# Patient Record
Sex: Female | Born: 1995 | Race: Black or African American | Hispanic: No | Marital: Married | Smoking: Never smoker
Health system: Southern US, Community
[De-identification: ages and names within clinical notes are randomized; demographics above are authoritative.]

## PROBLEM LIST (undated history)

## (undated) DIAGNOSIS — T7840XA Allergy, unspecified, initial encounter: Secondary | ICD-10-CM

## (undated) DIAGNOSIS — J45909 Unspecified asthma, uncomplicated: Secondary | ICD-10-CM

## (undated) DIAGNOSIS — D649 Anemia, unspecified: Secondary | ICD-10-CM

## (undated) DIAGNOSIS — M419 Scoliosis, unspecified: Secondary | ICD-10-CM

## (undated) HISTORY — DX: Unspecified asthma, uncomplicated: J45.909

## (undated) HISTORY — PX: WISDOM TOOTH EXTRACTION: SHX21

## (undated) HISTORY — DX: Allergy, unspecified, initial encounter: T78.40XA

## (undated) HISTORY — DX: Anemia, unspecified: D64.9

---

## 2018-07-01 ENCOUNTER — Emergency Department (HOSPITAL_COMMUNITY)
Admission: EM | Admit: 2018-07-01 | Discharge: 2018-07-01 | Disposition: A | Discharge status: Home / Self Care | Payer: Self-pay | Attending: Emergency Medicine | Admitting: Emergency Medicine

## 2018-07-01 ENCOUNTER — Encounter (HOSPITAL_COMMUNITY): Payer: Self-pay

## 2018-07-01 ENCOUNTER — Other Ambulatory Visit: Payer: Self-pay

## 2018-07-01 DIAGNOSIS — N390 Urinary tract infection, site not specified: Secondary | ICD-10-CM | POA: Insufficient documentation

## 2018-07-01 LAB — URINALYSIS, ROUTINE W REFLEX MICROSCOPIC
Bilirubin Urine: NEGATIVE
Glucose, UA: NEGATIVE mg/dL
Hgb urine dipstick: NEGATIVE
Ketones, ur: NEGATIVE mg/dL
Nitrite: NEGATIVE
Protein, ur: 30 mg/dL — AB
Specific Gravity, Urine: 1.021 (ref 1.005–1.030)
WBC, UA: >50 WBC/hpf — ABNORMAL HIGH (ref 0–5)
pH: 6 (ref 5.0–8.0)

## 2018-07-01 MED ORDER — CEPHALEXIN 500 MG PO CAPS: 500.0000 mg | ORAL_CAPSULE | Freq: Four times a day (QID) | ORAL | 0 refills | Status: DC

## 2018-07-01 NOTE — ED Provider Notes (Signed)
[facility] EMERGENCY DEPARTMENT Provider Note   CSN: [id number] Arrival date & time: 07/01/18  1546     History   Chief Complaint Chief Complaint  Patient presents with  . Urinary Tract Infection    HPI Anneke I Votaw is a 22 y.o. female who presents to ED for 5-day history of dysuria, urinary frequency.  States that she does not have a history of UTIs.  She does endorse having a kidney infection 2 years ago, but does not feel similar to the symptoms.  Has not taken any medications to help with her symptoms.  Denies any hematuria, possibility of pregnancy, back pain, fever, vaginal discharge or pelvic pain.  HPI  History reviewed. No pertinent past medical history.  There are no active problems to display for this patient.   Past Surgical History:  Procedure Laterality Date  . WISDOM TOOTH EXTRACTION       OB History   None      Home Medications    Prior to Admission medications   Medication Sig Start Date End Date Taking? Authorizing Provider  cephALEXin (KEFLEX) 500 MG capsule Take 1 capsule (500 mg total) by mouth 4 (four) times daily. 07/01/18   Dietrich Pates, PA-C    Family History No family history on file.  Social History Social History   Tobacco Use  . Smoking status: Not on file  Substance Use Topics  . Alcohol use: Not on file  . Drug use: Not on file     Allergies   Patient has no allergy information on record.   Review of Systems Review of Systems  Constitutional: Negative for chills and fever.  Gastrointestinal: Negative for abdominal pain.  Genitourinary: Positive for dysuria and frequency.     Physical Exam Updated Vital Signs BP 108/70 (BP Location: Right Arm)   Pulse 82   Temp 98.7 F (37.1 C) (Oral)   Resp 18   Ht 5\' 10"  (1.778 m)   Wt 56.7 kg   SpO2 100%   BMI 17.94 kg/m   Physical Exam  Constitutional: She appears well-developed and well-nourished. No distress.  HENT:  Head: Normocephalic  and atraumatic.  Eyes: Conjunctivae and EOM are normal. No scleral icterus.  Neck: Normal range of motion.  Pulmonary/Chest: Effort normal. No respiratory distress.  Abdominal: Soft. There is no tenderness.  No abdominal tenderness to palpation.  No CVA tenderness.  Neurological: She is alert.  Skin: No rash noted. She is not diaphoretic.  Psychiatric: She has a normal mood and affect.  Nursing note and vitals reviewed.    ED Treatments / Results  Labs (all labs ordered are listed, but only abnormal results are displayed) Labs Reviewed  URINALYSIS, ROUTINE W REFLEX MICROSCOPIC - Abnormal; Notable for the following components:      Result Value   APPearance HAZY (*)    Protein, ur 30 (*)    Leukocytes, UA SMALL (*)    WBC, UA >50 (*)    Bacteria, UA RARE (*)    All other components within normal limits    EKG None  Radiology No results found.  Procedures Procedures (including critical care time)  Medications Ordered in ED Medications - No data to display   Initial Impression / Assessment and Plan / ED Course  I have reviewed the triage vital signs and the nursing notes.  Pertinent labs & imaging results that were available during my care of the patient were reviewed by me and considered in my medical  decision making (see chart for details).     22 year old female presents to ED for 5-day history of dysuria and urinary frequency.  No history of similar symptoms in the past.  She did have what appears to be a kidney infection approximately 2 years ago.  On exam she is overall well-appearing.  She is afebrile with no recent use of antipyretics.  No abdominal tenderness to palpation or CVA tenderness noted.  Urinalysis shows evidence of UTI with leukocytes, pyuria and some bacteria.  Will give patient prescription for Keflex and advised her to complete the entire course of this medication.  Advised to return to ED for any severe worsening symptoms.  Patient is  hemodynamically stable, in NAD, and able to ambulate in the ED. Evaluation does not show pathology that would require ongoing emergent intervention or inpatient treatment. I explained the diagnosis to the patient. Pain has been managed and has no complaints prior to discharge. Patient is comfortable with above plan and is stable for discharge at this time. All questions were answered prior to disposition. Strict return precautions for returning to the ED were discussed. Encouraged follow up with PCP.    Portions of this note were generated with Scientist, clinical (histocompatibility and immunogenetics)Dragon dictation software. Dictation errors may occur despite best attempts at proofreading.   Final Clinical Impressions(s) / ED Diagnoses   Final diagnoses:  Lower urinary tract infectious disease    ED Discharge Orders         Ordered    cephALEXin (KEFLEX) 500 MG capsule  4 times daily     07/01/18 [id number]           Becky Berberian, PA-C 07/01/18 1646    Benjiman CorePickering, Nathan, MD 07/01/18 [phone]

## 2018-07-01 NOTE — Discharge Instructions (Signed)
Please complete the entire course of antibiotics regardless of symptom improvement to prevent worsening or recurrence of your infection. Take the Azo as needed for urinary discomfort. Return to ED for worsening symptoms, fevers, severe back pain or abdominal pain.

## 2018-07-01 NOTE — ED Triage Notes (Signed)
Pt reports burning and pain with urination since Saturday, hx of kidney infections.

## 2019-05-15 ENCOUNTER — Encounter (HOSPITAL_COMMUNITY): Payer: Self-pay | Admitting: Emergency Medicine

## 2019-05-15 ENCOUNTER — Other Ambulatory Visit: Payer: Self-pay

## 2019-05-15 ENCOUNTER — Emergency Department (HOSPITAL_COMMUNITY)
Admission: EM | Admit: 2019-05-15 | Discharge: 2019-05-16 | Disposition: A | Discharge status: Home / Self Care | Payer: Self-pay | Attending: Emergency Medicine | Admitting: Emergency Medicine

## 2019-05-15 DIAGNOSIS — R1013 Epigastric pain: Secondary | ICD-10-CM | POA: Insufficient documentation

## 2019-05-15 DIAGNOSIS — R1011 Right upper quadrant pain: Secondary | ICD-10-CM | POA: Insufficient documentation

## 2019-05-15 DIAGNOSIS — R112 Nausea with vomiting, unspecified: Secondary | ICD-10-CM

## 2019-05-15 LAB — CBC
HCT: 34 % — ABNORMAL LOW (ref 36.0–46.0)
Hemoglobin: 11.7 g/dL — ABNORMAL LOW (ref 12.0–15.0)
MCH: 31.3 pg (ref 26.0–34.0)
MCHC: 34.4 g/dL (ref 30.0–36.0)
MCV: 90.9 fL (ref 80.0–100.0)
Platelets: 245 10*3/uL (ref 150–400)
RBC: 3.74 MIL/uL — ABNORMAL LOW (ref 3.87–5.11)
RDW: 12.2 % (ref 11.5–15.5)
WBC: 6 10*3/uL (ref 4.0–10.5)
nRBC: 0 % (ref 0.0–0.2)

## 2019-05-15 LAB — COMPREHENSIVE METABOLIC PANEL
ALT: 13 U/L (ref 0–44)
AST: 27 U/L (ref 15–41)
Albumin: 3.7 g/dL (ref 3.5–5.0)
Alkaline Phosphatase: 72 U/L (ref 38–126)
Anion gap: 11 (ref 5–15)
BUN: 7 mg/dL (ref 6–20)
CO2: 23 mmol/L (ref 22–32)
Calcium: 9 mg/dL (ref 8.9–10.3)
Chloride: 102 mmol/L (ref 98–111)
Creatinine, Ser: 0.9 mg/dL (ref 0.44–1.00)
GFR calc Af Amer: >60 mL/min (ref >60)
GFR calc non Af Amer: >60 mL/min (ref >60)
Glucose, Bld: 116 mg/dL — ABNORMAL HIGH (ref 70–99)
Potassium: 3.8 mmol/L (ref 3.5–5.1)
Sodium: 136 mmol/L (ref 135–145)
Total Bilirubin: 0.3 mg/dL (ref 0.3–1.2)
Total Protein: 7.3 g/dL (ref 6.5–8.1)

## 2019-05-15 LAB — URINALYSIS, ROUTINE W REFLEX MICROSCOPIC
Bacteria, UA: NONE SEEN
Bilirubin Urine: NEGATIVE
Glucose, UA: NEGATIVE mg/dL
Hgb urine dipstick: NEGATIVE
Ketones, ur: 20 mg/dL — AB
Leukocytes,Ua: NEGATIVE
Nitrite: NEGATIVE
Protein, ur: 30 mg/dL — AB
Specific Gravity, Urine: 1.025 (ref 1.005–1.030)
pH: 5 (ref 5.0–8.0)

## 2019-05-15 LAB — I-STAT BETA HCG BLOOD, ED (MC, WL, AP ONLY): I-stat hCG, quantitative: <5 m[IU]/mL (ref <5)

## 2019-05-15 LAB — LIPASE, BLOOD: Lipase: 29 U/L (ref 11–51)

## 2019-05-15 MED ORDER — SODIUM CHLORIDE 0.9% FLUSH
3.0000 mL | Freq: Once | INTRAVENOUS | Status: AC
  Administered 2019-05-16: 3 mL via INTRAVENOUS

## 2019-05-15 NOTE — ED Triage Notes (Signed)
C/o nausea/vomiting that last for 1 day every month since March.  Denies abd pain.  Thought it was related to period but nausea/vomiting didn't occur at the same time this month.

## 2019-05-16 ENCOUNTER — Emergency Department (HOSPITAL_COMMUNITY): Payer: Self-pay

## 2019-05-16 MED ORDER — ONDANSETRON 4 MG PO TBDP: 4.0000 mg | ORAL_TABLET | Freq: Three times a day (TID) | ORAL | 0 refills | Status: DC | PRN

## 2019-05-16 MED ORDER — ONDANSETRON HCL 4 MG/2ML IJ SOLN
4.0000 mg | Freq: Once | INTRAMUSCULAR | Status: AC
  Administered 2019-05-16: 4 mg via INTRAVENOUS
  Filled 2019-05-16: qty 2

## 2019-05-16 MED ORDER — SODIUM CHLORIDE 0.9 % IV BOLUS
1000.0000 mL | Freq: Once | INTRAVENOUS | Status: AC
  Administered 2019-05-16: 1000 mL via INTRAVENOUS

## 2019-05-16 MED ORDER — FENTANYL CITRATE (PF) 100 MCG/2ML IJ SOLN
50.0000 ug | Freq: Once | INTRAMUSCULAR | Status: AC
  Administered 2019-05-16: 50 ug via INTRAVENOUS
  Filled 2019-05-16: qty 2

## 2019-05-16 NOTE — ED Provider Notes (Signed)
[facility] Provider Note   CSN: [id number] Arrival date & time: 05/15/19  2121     History   Chief Complaint Chief Complaint  Patient presents with  . Nausea  . Emesis    HPI Sara Duran is a 23 y.o. female with no significant past medical history who presents to the ED for nausea and vomiting. Patient notes that since changing her birth control in March she has had monthly episodes of emesis 13 days prior to her menstrual period; however, this month she had her normal emesis 13 days prior to her menstrual period and now is vomiting again. Patient notes she has had 3 episodes of non-bloody, bilious emesis today. Her LMP was 10/19. Emesis is associated with RUQ and epigastric pain. She denies recent antibiotic usage. Normal bowel movements with her last being today. No abdominal surgeries. Patient denies fever, chills, urinary symptoms, vaginal symptoms, chest pain, and shortness of breath.   History reviewed. No pertinent past medical history.  There are no active problems to display for this patient.   Past Surgical History:  Procedure Laterality Date  . WISDOM TOOTH EXTRACTION       OB History   No obstetric history on file.      Home Medications    Prior to Admission medications   Medication Sig Start Date End Date Taking? Authorizing Provider  ibuprofen (ADVIL) 600 MG tablet Take 500 tablets by mouth as needed for pain. 10/11/15  Yes [provider]  cephALEXin (KEFLEX) 500 MG capsule Take 1 capsule (500 mg total) by mouth 4 (four) times daily. Patient not taking: Reported on 05/16/2019 07/01/18   Dietrich PatesKhatri, Hina, PA-C  ondansetron (ZOFRAN ODT) 4 MG disintegrating tablet Take 1 tablet (4 mg total) by mouth every 8 (eight) hours as needed for nausea or vomiting. 05/16/19   Renee Harderheek, Nyshawn Gowdy B, PA-C    Family History No family history on file.  Social History Social History   Tobacco Use  . Smoking status: Never  Smoker  . Smokeless tobacco: Never Used  Substance Use Topics  . Alcohol use: Never    Frequency: Never  . Drug use: Never     Allergies   Patient has no allergy information on record.   Review of Systems Review of Systems  Constitutional: Negative for chills and fever.  Respiratory: Negative for shortness of breath.   Cardiovascular: Negative for chest pain.  Gastrointestinal: Positive for abdominal pain, nausea and vomiting. Negative for abdominal distention, constipation and diarrhea.     Physical Exam Updated Vital Signs BP 103/61 (BP Location: Right Arm)   Pulse 82   Temp 98.9 F (37.2 C) (Oral)   Resp 14   LMP 05/06/2019   SpO2 99%   Physical Exam Constitutional:      General: She is not in acute distress.    Appearance: She is not ill-appearing.  HENT:     Head: Normocephalic.  Neck:     Musculoskeletal: Neck supple.  Cardiovascular:     Rate and Rhythm: Normal rate and regular rhythm.     Pulses: Normal pulses.     Heart sounds: Normal heart sounds. No murmur. No friction rub. No gallop.   Pulmonary:     Effort: Pulmonary effort is normal.     Breath sounds: Normal breath sounds.  Abdominal:     General: Abdomen is flat. Bowel sounds are normal. There is no distension.     Palpations: Abdomen is soft.  Tenderness: There is abdominal tenderness. There is no guarding or rebound.     Comments: Tenderness in epigastric and RUQ region. Negative Murphys. No rebound. No guarding.  Musculoskeletal: Normal range of motion.     Comments: Able to move all extremities without difficulty  Skin:    General: Skin is warm.  Neurological:     General: No focal deficit present.     Mental Status: She is alert.      ED Treatments / Results  Labs (all labs ordered are listed, but only abnormal results are displayed) Labs Reviewed  COMPREHENSIVE METABOLIC PANEL - Abnormal; Notable for the following components:      Result Value   Glucose, Bld 116 (*)    All  other components within normal limits  CBC - Abnormal; Notable for the following components:   RBC 3.74 (*)    Hemoglobin 11.7 (*)    HCT 34.0 (*)    All other components within normal limits  URINALYSIS, ROUTINE W REFLEX MICROSCOPIC - Abnormal; Notable for the following components:   Ketones, ur 20 (*)    Protein, ur 30 (*)    All other components within normal limits  LIPASE, BLOOD  I-STAT BETA HCG BLOOD, ED ([facility], [facility], [facility] ONLY)    EKG None  Radiology US Abdomen Limited Ruq  Result Date: 05/16/2019 CLINICAL DATA:  Right upper quadrant pain and nausea. EXAM: ULTRASOUND ABDOMEN LIMITED RIGHT UPPER QUADRANT COMPARISON:  None. FINDINGS: Gallbladder: No gallstones or wall thickening visualized. No sonographic Murphy sign noted by sonographer. Common bile duct: Diameter: 1.7 mm Liver: No focal lesion identified. Within normal limits in parenchymal echogenicity. Portal vein is patent on color Doppler imaging with normal direction of blood flow towards the liver. Other: None. IMPRESSION: 1. Normal exam. Electronically Signed   By: Kerby Moors M.D.   On: 05/16/2019 06:27    Procedures Procedures (including critical care time)  Medications Ordered in ED Medications  sodium chloride flush (NS) 0.9 % injection 3 mL (3 mLs Intravenous Given 05/16/19 0421)  ondansetron (ZOFRAN) injection 4 mg (4 mg Intravenous Given 05/16/19 0423)  sodium chloride 0.9 % bolus 1,000 mL (1,000 mLs Intravenous New Bag/Given 05/16/19 0421)  fentaNYL (SUBLIMAZE) injection 50 mcg (50 mcg Intravenous Given 05/16/19 0423)     Initial Impression / Assessment and Plan / ED Course  I have reviewed the triage vital signs and the nursing notes.  Pertinent labs & imaging results that were available during my care of the patient were reviewed by me and considered in my medical decision making (see chart for details).       Sara Duran is a 23 year old female who presents to the ED for an evaluation of emesis.  Patient is afebrile. Vitals within normal limits. On physical exam, patient is in no acute distress and rather well appearing. Abdomen is soft, non-distended with tenderness to palpation in epigastric and RUQ. Negative Murphys. No rebound. No guarding. Triage ordered routine labs. Will place order for RUQ [place] given she has had 3 episodes of bilious emesis today. Low suspicion of appendicitis due to negative McBurney's point tenderness. CT not warranted at this time. Will give 1L bolus, Zofran, and fentanyl.   CBC unremarkable except for mild anemia at 11.7. No labs in chart to compare to baseline, but asymptomatic. CMP unremarkable. UA positive for proteinuria and ketones. Suspect due to emesis and decreased appetite. No signs of infection. Lipase normal. RUQ US unremarkable which I have personally reviewed.  Discussed results with patient. Will treat patient symptomatically with Zofran. Patient given GI number and instructed to call on Monday to schedule an appointment. PCP phone number also given to patient. Strict ED precautions discussed with patient. Patient states understanding and agrees to plan. Patient discharged home in no acute distress and vitals within normal limits.  Final Clinical Impressions(s) / ED Diagnoses   Final diagnoses:  RUQ pain  Intractable vomiting with nausea, unspecified vomiting type    ED Discharge Orders         Ordered    ondansetron (ZOFRAN ODT) 4 MG disintegrating tablet  Every 8 hours PRN     05/16/19 0648           Renee Harder, PA-C 05/16/19 2229    Gilda Crease, MD 05/16/19 [phone]

## 2019-05-16 NOTE — ED Notes (Signed)
Pt taken to US

## 2019-05-16 NOTE — Discharge Instructions (Addendum)
I have given you a number for a GI doctor. Please call them on Monday and schedule an appointment for further evaluation. I am also including a number for a primary care doctor. I recommend calling them to establish care. Please return to the ER if you experience new or worsening symptoms. I am also sending you home with Zofran. Use as needed for nausea.

## 2019-05-17 ENCOUNTER — Encounter: Payer: Self-pay | Admitting: Physician Assistant

## 2019-05-26 ENCOUNTER — Ambulatory Visit (INDEPENDENT_AMBULATORY_CARE_PROVIDER_SITE_OTHER): Payer: BC Managed Care – PPO | Admitting: Physician Assistant

## 2019-05-26 ENCOUNTER — Encounter: Payer: Self-pay | Admitting: Physician Assistant

## 2019-05-26 ENCOUNTER — Other Ambulatory Visit: Payer: Self-pay

## 2019-05-26 VITALS — BP 86/60 | HR 76 | Temp 98.8°F | Ht 68.75 in | Wt 122.2 lb

## 2019-05-26 DIAGNOSIS — Z1159 Encounter for screening for other viral diseases: Secondary | ICD-10-CM

## 2019-05-26 DIAGNOSIS — R112 Nausea with vomiting, unspecified: Secondary | ICD-10-CM | POA: Diagnosis not present

## 2019-05-26 MED ORDER — ONDANSETRON HCL 4 MG PO TABS: 4.0000 mg | ORAL_TABLET | Freq: Three times a day (TID) | ORAL | 1 refills | Status: DC | PRN

## 2019-05-26 NOTE — Progress Notes (Signed)
Chief Complaint: Follow-up ER visit for intractable vomiting and nausea  HPI:    Sara Duran is a 23 year old female with a past medical history as listed below, who presents to clinic today after being seen in the ER on 05/15/2019 with intractable nausea and vomiting.    05/15/2019 patient was seen in the ER and described that since changing her birth control March she had monthly episodes of vomiting 13 days prior to her menstrual period, however at that time went through this but then also was vomiting again.  She had 3 episodes of nonbloody, bilious emesis.  Also with right upper quadrant epigastric pain.  CMP was normal.  CBC with a hemoglobin minimally decreased 11.7.  Lipase normal, beta-hCG normal.  Right upper quadrant ultrasound was normal.  Patient was given Zofran.    Today, the patient tells me that since March and changing to a new birth control she has had trouble with nausea and vomiting.  Typically this happens 13 days before she gets her period.  This has happened every month, but this last time it started after she had already gotten her period which was abnormal for her.  Tells me that typically when these episodes come on they last a few hours, but this last one lasted 8 hours, where she is so nauseous that she just continuously vomits.  Patient tells me she was still nauseous a week ago Monday, 06/17/2019 and took a Zofran which did help.  She has continued to needs some Zofran over the past week.      Does admit to smoking marijuana twice a day, tells me it helps her with the nausea as well as going to sleep.  Tells me she did take a break from marijuana for 6 months and this made no change to her nausea symptoms.  Tells me she has been smoking twice a day every day for the past year.    Denies fever, chills, abdominal pain or hematemesis or change in bowel habits.  Past Medical History:  Diagnosis Date  . Asthma     Past Surgical History:  Procedure Laterality Date  .  WISDOM TOOTH EXTRACTION      Current Outpatient Medications  Medication Sig Dispense Refill  . acetaminophen (TYLENOL) 500 MG tablet Take 500 mg by mouth as needed.    . ondansetron (ZOFRAN ODT) 4 MG disintegrating tablet Take 1 tablet (4 mg total) by mouth every 8 (eight) hours as needed for nausea or vomiting. 20 tablet 0   No current facility-administered medications for this visit.     Allergies as of 05/26/2019  . (No Known Allergies)    Family History  Problem Relation Age of Onset  . Diabetes Paternal Grandfather     Social History   Socioeconomic History  . Marital status: Single    Spouse name: Not on file  . Number of children: 0  . Years of education: Not on file  . Highest education level: Not on file  Occupational History  . Occupation: [employer]  . Financial resource strain: Not on file  . Food insecurity    Worry: Not on file    Inability: Not on file  . Transportation needs    Medical: Not on file    Non-medical: Not on file  Tobacco Use  . Smoking status: Never Smoker  . Smokeless tobacco: Never Used  Substance and Sexual Activity  . Alcohol use: Yes    Frequency: Never  Comment: occasional  . Drug use: Never  . Sexual activity: Not on file  Lifestyle  . Physical activity    Days per week: Not on file    Minutes per session: Not on file  . Stress: Not on file  Relationships  . Social Herbalist on phone: Not on file    Gets together: Not on file    Attends religious service: Not on file    Active member of club or organization: Not on file    Attends meetings of clubs or organizations: Not on file    Relationship status: Not on file  . Intimate partner violence    Fear of current or ex partner: Not on file    Emotionally abused: Not on file    Physically abused: Not on file    Forced sexual activity: Not on file  Other Topics Concern  . Not on file  Social History Narrative  . Not on file    Review of  Systems:    Constitutional: No weight loss, fever or chills Skin: No rash Cardiovascular: No chest pain Respiratory: No SOB  Gastrointestinal: See HPI and otherwise negative Genitourinary: No dysuria  Neurological: No headache, dizziness or syncope Musculoskeletal: No new muscle or joint pain Hematologic: No bleeding  Psychiatric: No history of depression or anxiety   Physical Exam:  Vital signs: Temp 98.8 F (37.1 C)   Ht 5' 8.75" (1.746 m) Comment: height measured without shoes  Wt 122 lb 4 oz (55.5 kg)   LMP 05/06/2019   BMI 18.18 kg/m   Constitutional:   Pleasant AA female appears to be in NAD, Well developed, Well nourished, alert and cooperative Head:  Normocephalic and atraumatic. Eyes:   PEERL, EOMI. No icterus. Conjunctiva pink. Ears:  Normal auditory acuity. Neck:  Supple Throat: Oral cavity and pharynx without inflammation, swelling or lesion.  Respiratory: Respirations even and unlabored. Lungs clear to auscultation bilaterally.   No wheezes, crackles, or rhonchi.  Cardiovascular: Normal S1, S2. No MRG. Regular rate and rhythm. No peripheral edema, cyanosis or pallor.  Gastrointestinal:  Soft, nondistended, nontender. No rebound or guarding. Normal bowel sounds. No appreciable masses or hepatomegaly. Rectal:  Not performed.  Msk:  Symmetrical without gross deformities. Without edema, no deformity or joint abnormality.  Neurologic:  Alert and  oriented x4;  grossly normal neurologically.  Skin:   Dry and intact without significant lesions or rashes. Psychiatric: Demonstrates good judgement and reason without abnormal affect or behaviors.  RELEVANT LABS AND IMAGING: CBC    Component Value Date/Time   WBC 6.0 05/15/2019 2232   RBC 3.74 (L) 05/15/2019 2232   HGB 11.7 (L) 05/15/2019 2232   HCT 34.0 (L) 05/15/2019 2232   PLT 245 05/15/2019 2232   MCV 90.9 05/15/2019 2232   MCH 31.3 05/15/2019 2232   MCHC 34.4 05/15/2019 2232   RDW 12.2 05/15/2019 2232    CMP      Component Value Date/Time   NA 136 05/15/2019 2232   K 3.8 05/15/2019 2232   CL 102 05/15/2019 2232   CO2 23 05/15/2019 2232   GLUCOSE 116 (H) 05/15/2019 2232   BUN 7 05/15/2019 2232   CREATININE 0.90 05/15/2019 2232   CALCIUM 9.0 05/15/2019 2232   PROT 7.3 05/15/2019 2232   ALBUMIN 3.7 05/15/2019 2232   AST 27 05/15/2019 2232   ALT 13 05/15/2019 2232   ALKPHOS 72 05/15/2019 2232   BILITOT 0.3 05/15/2019 2232   GFRNONAA >60 05/15/2019  2232   GFRAA >60 05/15/2019 2232    Assessment: 1.  Cyclical nausea and vomiting: Seems related to menstrual cycle ever since patient switched birth controls, but most recently has become continuous over the past couple of weeks, does admit to marijuana use twice daily; consider relation to marijuana versus other  Plan: 1.  Discussed with patient that it very well could be her marijuana which is causing her nausea and vomiting but patient tells me that she does not believe this is the case.  She would like further investigation.  Told her to try taking a hot shower the next time she is very nauseous to see if this helps.  If it does more than likely it is the marijuana. 2.  Scheduled the patient for an EGD in the [facility] with Dr. Christella HartiganJacobs.  Did discuss risks, benefits, limitations and alternatives and the patient agrees to proceed. 3.  Prescribed Zofran 4 mg every 4-6 hours as needed for nausea #30 with 1 refill. 4.  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  Explained that she should discuss trying a switch to a different birth control because symptoms seemed to have started around starting this new one. 5.  Patient to follow in clinic per recommendations from Dr. Christella HartiganJacobs after time of procedure.  Hyacinth MeekerJennifer Dasean Brow, PA-C [facility] 05/26/2019, 2:03 PM

## 2019-05-26 NOTE — Patient Instructions (Signed)
You have been scheduled for an endoscopy. Please follow written instructions given to you at your visit today. If you use inhalers (even only as needed), please bring them with you on the day of your procedure.  We have sent the following medications to your pharmacy for you to pick up at your convenience:  Zofran 4 mg every 4-6 hours as needed for nausea

## 2019-05-27 NOTE — Progress Notes (Signed)
I agree with the above note, plan 

## 2019-06-09 ENCOUNTER — Encounter: Payer: Self-pay | Admitting: Gastroenterology

## 2019-06-15 ENCOUNTER — Telehealth: Payer: Self-pay | Admitting: Gastroenterology

## 2019-06-15 NOTE — Telephone Encounter (Signed)
covid test rescheduled for 06-21-19 and EGD rescheduled for 06-23-19 at 1000.  Dr. Ardis Hughs,  This pt had a co-worker that he had direct contact with who was covid positive last week.  Pt states she was in full PPE.  Is she ok to still come on Wednesday, 06-23-19?  Thanks, [employer]

## 2019-06-15 NOTE — Telephone Encounter (Signed)
Yes.  Ok to still plan for that.  Definitely needs the pre procedure covid test on the 30th.  Thanks

## 2019-06-16 ENCOUNTER — Encounter: Payer: BC Managed Care – PPO | Admitting: Gastroenterology

## 2019-06-21 ENCOUNTER — Other Ambulatory Visit: Payer: Self-pay | Admitting: Gastroenterology

## 2019-06-21 ENCOUNTER — Ambulatory Visit (INDEPENDENT_AMBULATORY_CARE_PROVIDER_SITE_OTHER): Payer: Self-pay

## 2019-06-21 DIAGNOSIS — Z1159 Encounter for screening for other viral diseases: Secondary | ICD-10-CM

## 2019-06-22 LAB — SARS CORONAVIRUS 2 (TAT 6-24 HRS): SARS Coronavirus 2: NEGATIVE

## 2019-06-23 ENCOUNTER — Other Ambulatory Visit: Payer: Self-pay

## 2019-06-23 ENCOUNTER — Ambulatory Visit (AMBULATORY_SURGERY_CENTER): Payer: BC Managed Care – PPO | Admitting: Gastroenterology

## 2019-06-23 ENCOUNTER — Encounter: Payer: Self-pay | Admitting: Gastroenterology

## 2019-06-23 VITALS — BP 127/90 | HR 110 | Temp 98.4°F | Resp 15 | Ht 68.0 in | Wt 122.0 lb

## 2019-06-23 DIAGNOSIS — K2950 Unspecified chronic gastritis without bleeding: Secondary | ICD-10-CM

## 2019-06-23 DIAGNOSIS — R112 Nausea with vomiting, unspecified: Secondary | ICD-10-CM

## 2019-06-23 MED ORDER — SODIUM CHLORIDE 0.9 % IV SOLN: 500.0000 mL | Freq: Once | INTRAVENOUS | Status: DC

## 2019-06-23 NOTE — Op Note (Signed)
[facility] Patient Name: Sara BankerDavette Knobloch Procedure Date: 06/23/2019 9:48 AM MRN: [id number] Endoscopist: Rachael Feeaniel P Rebakah Cokley , MD Age: 23 Referring MD:  Date of Birth: 04/27/1996 Gender: Female Account #: [id number] Procedure:                Upper GI endoscopy Indications:              Nausea with vomiting Medicines:                Monitored Anesthesia Care Procedure:                Pre-Anesthesia Assessment:                           - Prior to the procedure, a History and Physical                            was performed, and patient medications and                            allergies were reviewed. The patient's tolerance of                            previous anesthesia was also reviewed. The risks                            and benefits of the procedure and the sedation                            options and risks were discussed with the patient.                            All questions were answered, and informed consent                            was obtained. Prior Anticoagulants: The patient has                            taken no previous anticoagulant or antiplatelet                            agents. ASA Grade Assessment: II - A patient with                            mild systemic disease. After reviewing the risks                            and benefits, the patient was deemed in                            satisfactory condition to undergo the procedure.                           After obtaining informed consent, the endoscope was  passed under direct vision. Throughout the                            procedure, the patient's blood pressure, pulse, and                            oxygen saturations were monitored continuously. The                            Endoscope was introduced through the mouth, and                            advanced to the second part of duodenum. The upper                            GI endoscopy was accomplished  without difficulty.                            The patient tolerated the procedure well. Scope In: Scope Out: Findings:                 Minimal inflammation characterized by erythema was                            found in the gastric antrum. Biopsies were taken                            with a cold forceps for histology.                           The exam was otherwise without abnormality. Complications:            No immediate complications. Estimated blood loss:                            None. Estimated Blood Loss:     Estimated blood loss: none. Impression:               - Very mild distal gastritis. Biopsied to check for                            H. pylori.                           - The examination was otherwise normal. Recommendation:           - Patient has a contact number available for                            emergencies. The signs and symptoms of potential                            delayed complications were discussed with the                            patient. Return to normal activities tomorrow.  Written discharge instructions were provided to the                            patient.                           - Resume previous diet.                           - Continue present medications.                           - Await pathology results. IF biopsies are positive                            for H. pylori then you will be started on                            appropriate antibiotics.                           - Since your symptoms started around the time of                            your new birth control meds and also seem to occur                            on a montly cycle you should consider changing to a                            different BCP (under direction of your PCP or                            gynecologist).                           - Daily marijuana use can contribute to nausea. Rachael Fee, MD 06/23/2019 10:02:10  AM This report has been signed electronically.

## 2019-06-23 NOTE — Progress Notes (Signed)
Report given to PACU, vss 

## 2019-06-23 NOTE — Patient Instructions (Signed)
YOU HAD AN ENDOSCOPIC PROCEDURE TODAY AT THE [facility]:   Refer to the procedure report that was given to you for any specific questions about what was found during the examination.  If the procedure report does not answer your questions, please call your gastroenterologist to clarify.  If you requested that your care partner not be given the details of your procedure findings, then the procedure report has been included in a sealed envelope for you to review at your convenience later.  YOU SHOULD EXPECT: Some feelings of bloating in the abdomen. Passage of more gas than usual.  Walking can help get rid of the air that was put into your GI tract during the procedure and reduce the bloating. If you had a lower endoscopy (such as a colonoscopy or flexible sigmoidoscopy) you may notice spotting of blood in your stool or on the toilet paper. If you underwent a bowel prep for your procedure, you may not have a normal bowel movement for a few days.  Please Note:  You might notice some irritation and congestion in your nose or some drainage.  This is from the oxygen used during your procedure.  There is no need for concern and it should clear up in a day or so.  SYMPTOMS TO REPORT IMMEDIATELY:   Following upper endoscopy (EGD)  Vomiting of blood or coffee ground material  New chest pain or pain under the shoulder blades  Painful or persistently difficult swallowing  New shortness of breath  Fever of 100F or higher  Black, tarry-looking stools  For urgent or emergent issues, a gastroenterologist can be reached at any hour by calling [phone].   DIET:  We do recommend a small meal at first, but then you may proceed to your regular diet.  Drink plenty of fluids but you should avoid alcoholic beverages for 24 hours.  ACTIVITY:  You should plan to take it easy for the rest of today and you should NOT DRIVE or use heavy machinery until tomorrow (because of the sedation medicines used  during the test).    FOLLOW UP: Our staff will call the number listed on your records 48-72 hours following your procedure to check on you and address any questions or concerns that you may have regarding the information given to you following your procedure. If we do not reach you, we will leave a message.  We will attempt to reach you two times.  During this call, we will ask if you have developed any symptoms of COVID 19. If you develop any symptoms (ie: fever, flu-like symptoms, shortness of breath, cough etc.) before then, please call [phone].  If you test positive for Covid 19 in the 2 weeks post procedure, please call and report this information to us.    If any biopsies were taken you will be contacted by phone or by letter within the next 1-3 weeks.  Please call us at [phone] if you have not heard about the biopsies in 3 weeks.    SIGNATURES/CONFIDENTIALITY: You and/or your care partner have signed paperwork which will be entered into your electronic medical record.  These signatures attest to the fact that that the information above on your After Visit Summary has been reviewed and is understood.  Full responsibility of the confidentiality of this discharge information lies with you and/or your care-partner. 

## 2019-06-23 NOTE — Progress Notes (Signed)
Called to room to assist during endoscopic procedure.  Patient ID and intended procedure confirmed with present staff. Received instructions for my participation in the procedure from the performing physician.  

## 2019-06-25 ENCOUNTER — Telehealth: Payer: Self-pay | Admitting: *Deleted

## 2019-06-25 ENCOUNTER — Telehealth: Payer: Self-pay

## 2019-06-25 NOTE — Telephone Encounter (Signed)
No answer for post procedure call back. Not able to leave message. 

## 2019-06-25 NOTE — Telephone Encounter (Signed)
Left message on follow up call. 

## 2019-06-28 ENCOUNTER — Encounter: Payer: Self-pay | Admitting: Gastroenterology

## 2020-07-01 IMAGING — US US ABDOMEN LIMITED
1 series · 14 of 25 positions shown · non-contrast
Comparison: None.

CLINICAL DATA: Right upper quadrant pain and nausea.

EXAM:
ULTRASOUND ABDOMEN LIMITED RIGHT UPPER QUADRANT

[Series 1: us abdomen limited · 14 of 53 slices shown]
[im 1/53]
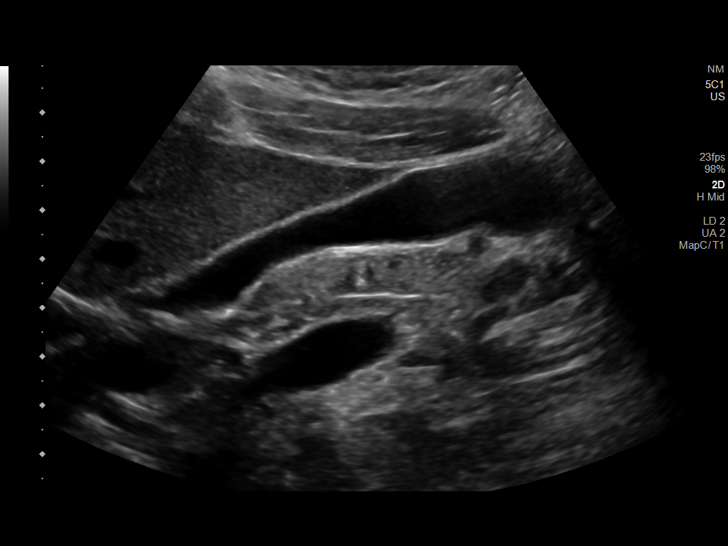
[im 5/53]
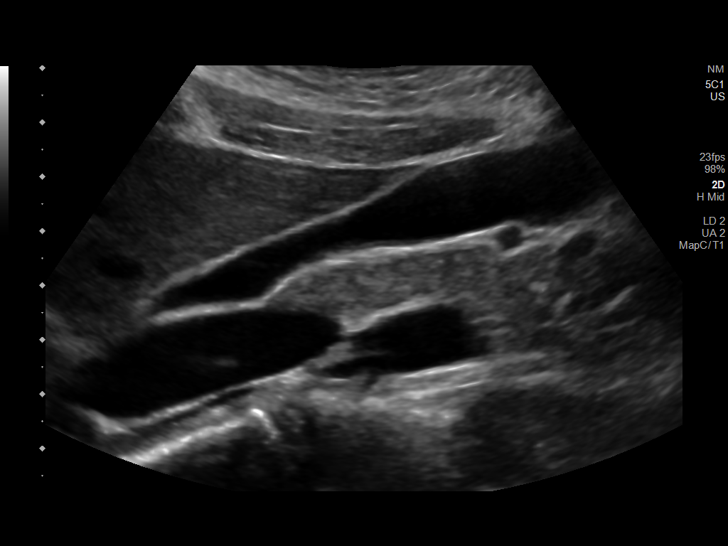
[im 9/53]
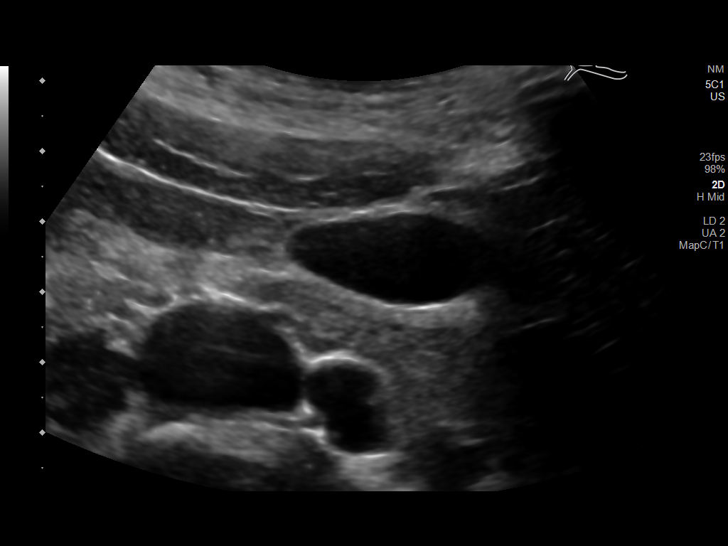
[im 14/53]
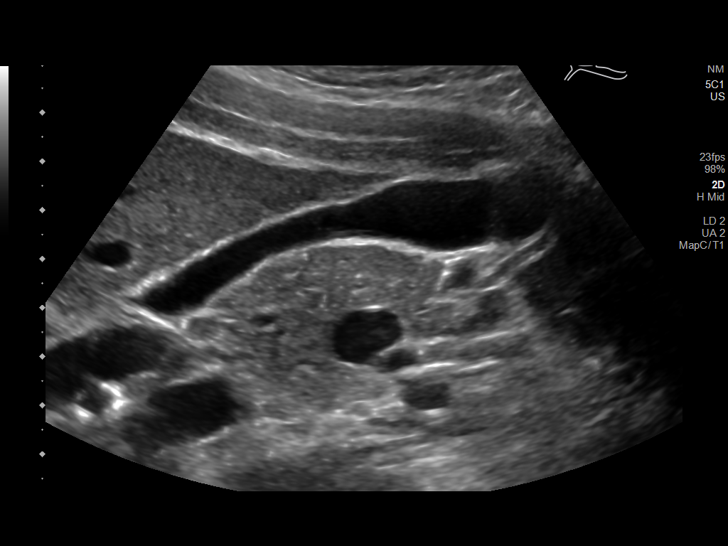
[im 18/53]
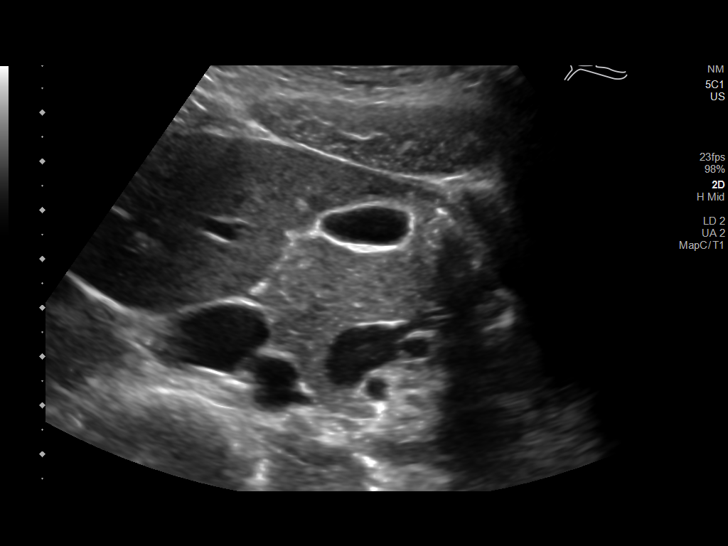
[im 20/53]
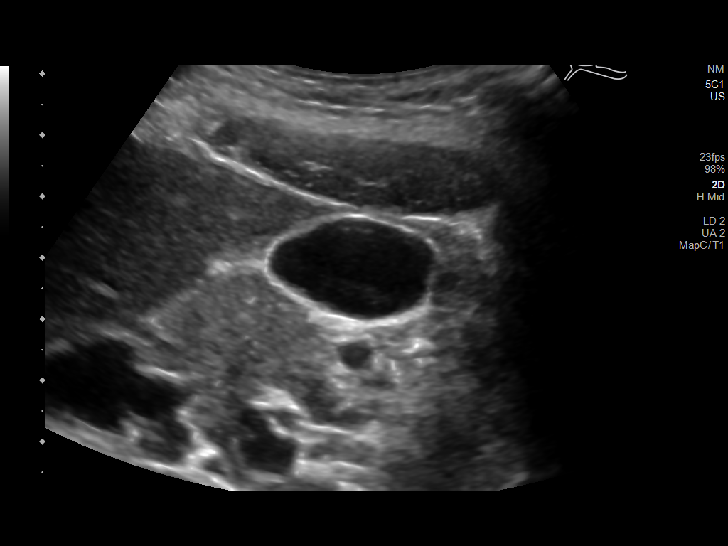
[im 24/53]
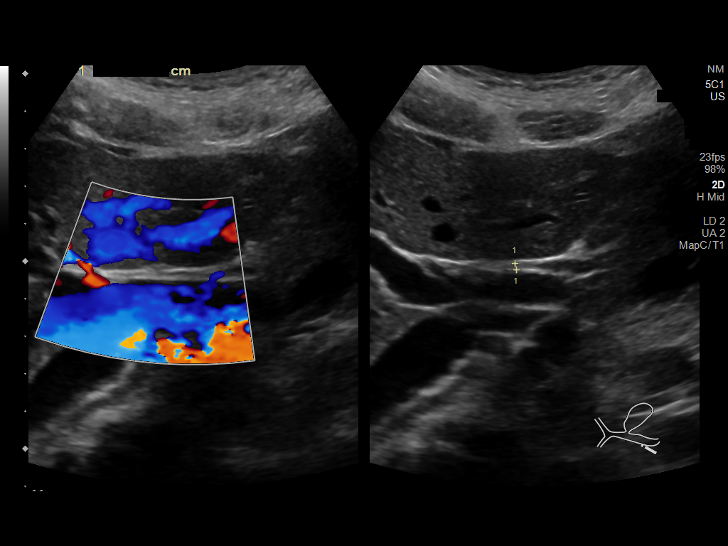
[im 29/53]
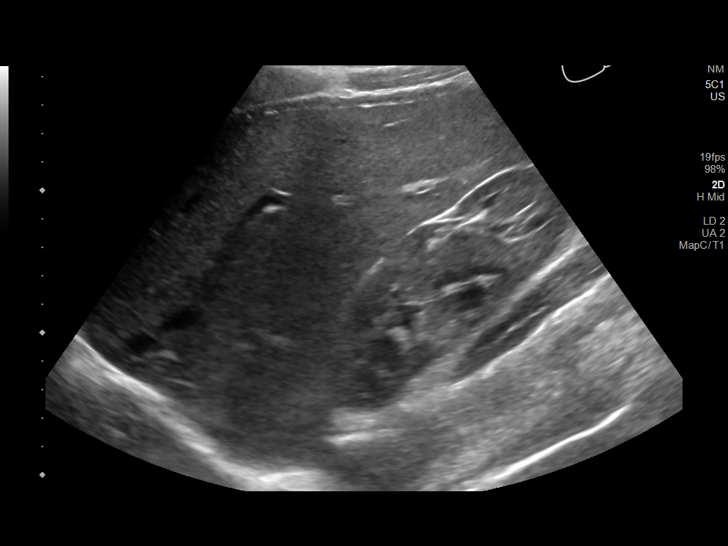
[im 33/53]
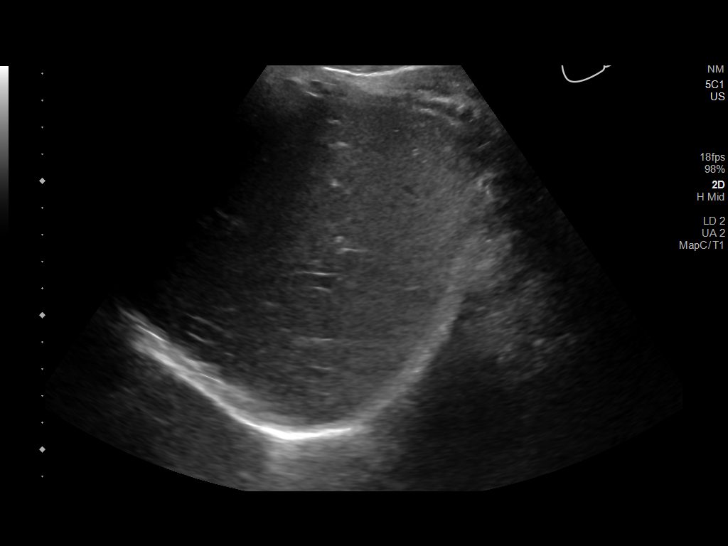
[im 35/53]
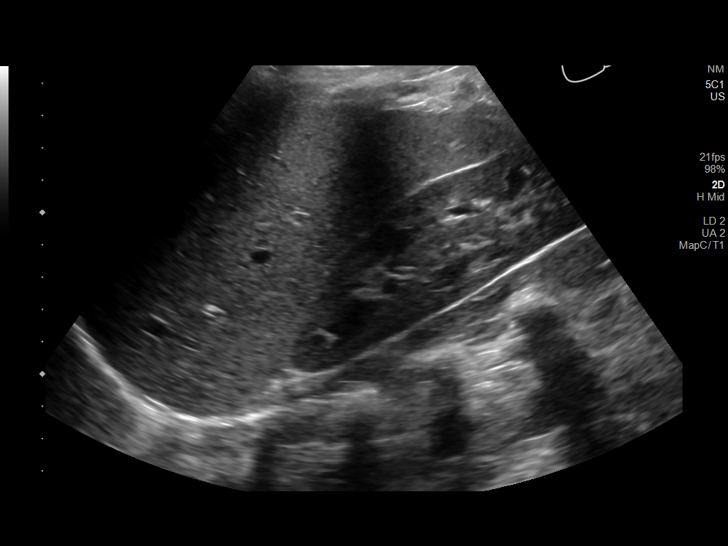
[im 40/53]
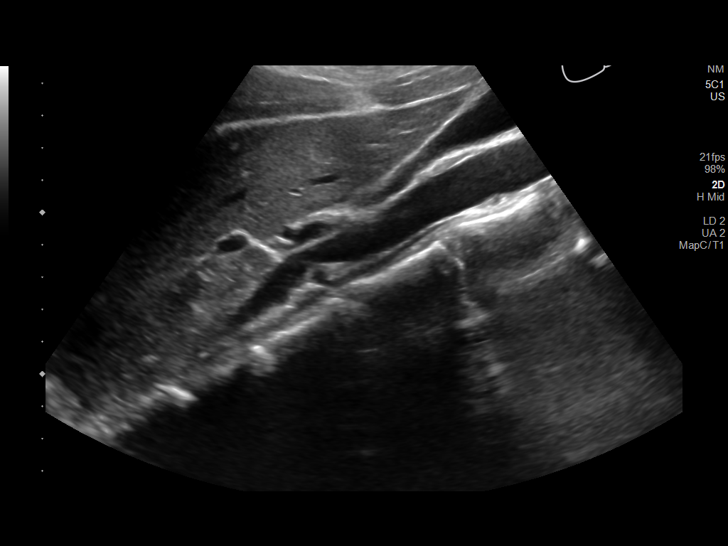
[im 44/53]
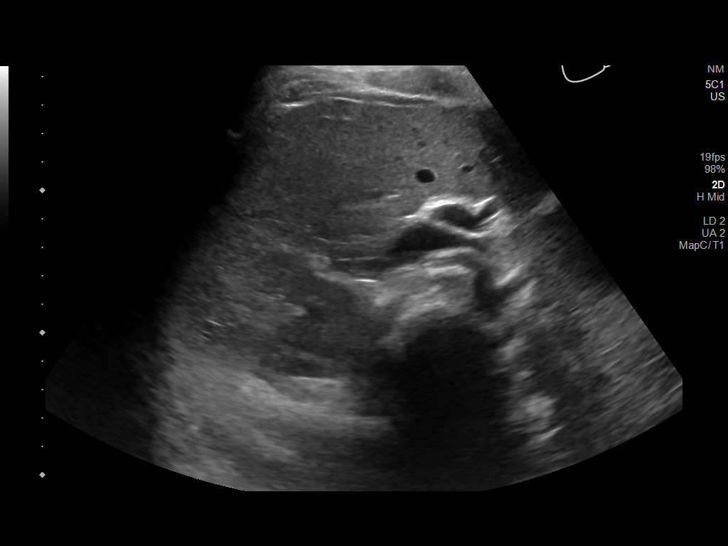
[im 48/53]
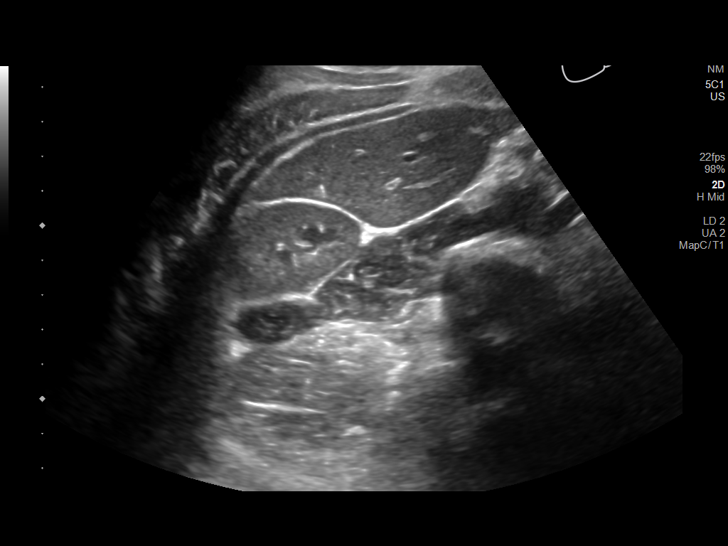
[im 53/53]
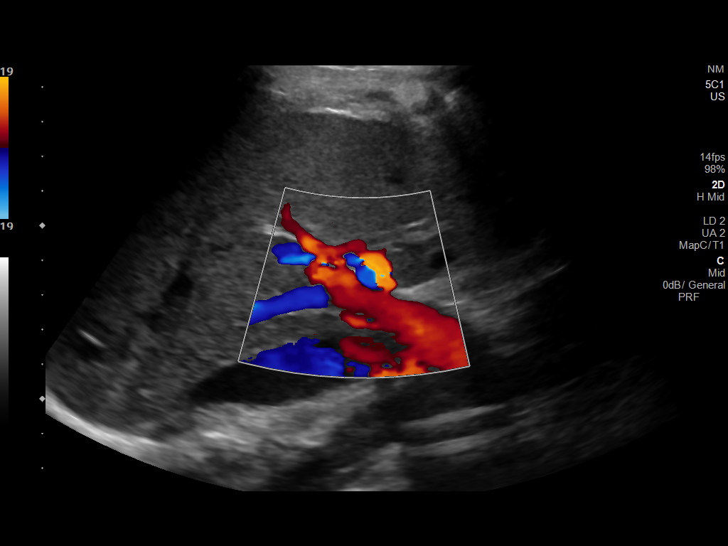

[14 of 25 positions shown; findings below may reference images not displayed]

FINDINGS: Gallbladder:

No gallstones or wall thickening visualized. No sonographic Murphy
sign noted by sonographer.

Common bile duct:

Diameter: 1.7 mm

Liver:

No focal lesion identified. Within normal limits in parenchymal
echogenicity. Portal vein is patent on color Doppler imaging with
normal direction of blood flow towards the liver.

Other: None.
IMPRESSION: 1. Normal exam.

## 2020-11-28 ENCOUNTER — Inpatient Hospital Stay (HOSPITAL_COMMUNITY)
Admission: AD | Admit: 2020-11-28 | Discharge: 2020-11-28 | Disposition: A | Discharge status: Home / Self Care | Payer: BC Managed Care – PPO | Attending: Obstetrics & Gynecology | Admitting: Obstetrics & Gynecology

## 2020-11-28 ENCOUNTER — Other Ambulatory Visit: Payer: Self-pay

## 2020-11-28 ENCOUNTER — Encounter (HOSPITAL_COMMUNITY): Payer: Self-pay | Admitting: Obstetrics & Gynecology

## 2020-11-28 DIAGNOSIS — M549 Dorsalgia, unspecified: Secondary | ICD-10-CM | POA: Insufficient documentation

## 2020-11-28 DIAGNOSIS — N898 Other specified noninflammatory disorders of vagina: Secondary | ICD-10-CM | POA: Diagnosis not present

## 2020-11-28 DIAGNOSIS — O26893 Other specified pregnancy related conditions, third trimester: Secondary | ICD-10-CM | POA: Diagnosis present

## 2020-11-28 DIAGNOSIS — Z3A38 38 weeks gestation of pregnancy: Secondary | ICD-10-CM | POA: Insufficient documentation

## 2020-11-28 DIAGNOSIS — O42913 Preterm premature rupture of membranes, unspecified as to length of time between rupture and onset of labor, third trimester: Secondary | ICD-10-CM | POA: Diagnosis not present

## 2020-11-28 LAB — POCT FERN TEST: POCT Fern Test: NEGATIVE

## 2020-11-28 NOTE — MAU Note (Signed)
Presents with c/o lower back pain and watery vaginal discharge.  Reports discharge clear and started today.  Denies VB.  Endorses +FM.

## 2020-11-28 NOTE — MAU Provider Note (Signed)
History     CSN: [id number]  Arrival date and time: 11/28/20 1449   Event Date/Time   First Provider Initiated Contact with Patient 11/28/20 1610      Chief Complaint  Patient presents with  . Back Pain  . Vaginal Discharge   HPI Sara Duran is a 25yo G1 at 38.5wks who presents for eval of increased vag d/c today; felt more damp, but didn't see any fluid. No bldg, ctx, or pain. Her preg has been followed by the [facility] service and has been essentially unremarkable.  OB History    Gravida  1   Para      Term      Preterm      AB      Living        SAB      IAB      Ectopic      Multiple      Live Births              Past Medical History:  Diagnosis Date  . Allergy   . Anemia   . Asthma     Past Surgical History:  Procedure Laterality Date  . WISDOM TOOTH EXTRACTION      Family History  Problem Relation Age of Onset  . Diabetes Paternal Grandfather   . Colon cancer Neg Hx     Social History   Tobacco Use  . Smoking status: Never Smoker  . Smokeless tobacco: Never Used  Vaping Use  . Vaping Use: Never used  Substance Use Topics  . Alcohol use: Not Currently    Comment: occasional  . Drug use: Never    Allergies: No Known Allergies  Medications Prior to Admission  Medication Sig Dispense Refill Last Dose  . acetaminophen (TYLENOL) 500 MG tablet Take 500 mg by mouth as needed.     . ondansetron (ZOFRAN ODT) 4 MG disintegrating tablet Take 1 tablet (4 mg total) by mouth every 8 (eight) hours as needed for nausea or vomiting. 20 tablet 0   . ondansetron (ZOFRAN) 4 MG tablet Take 1 tablet (4 mg total) by mouth every 8 (eight) hours as needed for nausea or vomiting. (Patient not taking: Reported on 06/23/2019) 30 tablet 1     Review of Systems No other pertinents other than what is listed in HPI Physical Exam   Blood pressure 114/73, pulse 98, temperature 98.4 F (36.9 C), temperature source Oral, resp. rate 19, weight 79.3 kg, SpO2 100  %.  Physical Exam Constitutional:      Appearance: Normal appearance.  HENT:     Head: Normocephalic.     Mouth/Throat:     Mouth: Mucous membranes are moist.  Cardiovascular:     Rate and Rhythm: Normal rate.  Pulmonary:     Effort: Pulmonary effort is normal.  Abdominal:     Comments: FHT 120-130s, +accels, no decels, Cat 1 UI  Genitourinary:    Comments: SSE: white/mucous d/c present, neg pool Cx 1+/80/vtx -2 Musculoskeletal:        General: Normal range of motion.     Cervical back: Normal range of motion.  Skin:    General: Skin is warm.  Neurological:     General: No focal deficit present.     Mental Status: She is alert.  Psychiatric:        Mood and Affect: Mood normal.        Thought Content: Thought content normal.    Fern: neg  MAU Course  Procedures  MDM SSE, SVE, NST, fern  Assessment and Plan  IUP@38 .5wks Vag discharge  D/C home with labor/leaking/bldg precautions Keep next sched OB appt on 11/30/20  Arabella Merles CNM 11/28/2020, 4:19 PM

## 2020-11-28 NOTE — Discharge Instructions (Signed)

## 2020-11-29 ENCOUNTER — Encounter (HOSPITAL_COMMUNITY): Payer: Self-pay | Admitting: Obstetrics & Gynecology

## 2020-11-29 ENCOUNTER — Other Ambulatory Visit: Payer: Self-pay

## 2020-11-29 ENCOUNTER — Inpatient Hospital Stay (HOSPITAL_COMMUNITY)
Admission: AD | Admit: 2020-11-29 | Discharge: 2020-12-02 | DRG: 787 | Disposition: A | Discharge status: Home / Self Care | Payer: BC Managed Care – PPO | Attending: Obstetrics & Gynecology | Admitting: Obstetrics & Gynecology

## 2020-11-29 DIAGNOSIS — D62 Acute posthemorrhagic anemia: Secondary | ICD-10-CM | POA: Diagnosis not present

## 2020-11-29 DIAGNOSIS — F32A Depression, unspecified: Secondary | ICD-10-CM | POA: Diagnosis not present

## 2020-11-29 DIAGNOSIS — O42913 Preterm premature rupture of membranes, unspecified as to length of time between rupture and onset of labor, third trimester: Secondary | ICD-10-CM

## 2020-11-29 DIAGNOSIS — J45990 Exercise induced bronchospasm: Secondary | ICD-10-CM | POA: Diagnosis present

## 2020-11-29 DIAGNOSIS — O26893 Other specified pregnancy related conditions, third trimester: Secondary | ICD-10-CM | POA: Diagnosis present

## 2020-11-29 DIAGNOSIS — O9952 Diseases of the respiratory system complicating childbirth: Principal | ICD-10-CM | POA: Diagnosis present

## 2020-11-29 DIAGNOSIS — F329 Major depressive disorder, single episode, unspecified: Secondary | ICD-10-CM | POA: Diagnosis not present

## 2020-11-29 DIAGNOSIS — Z3A38 38 weeks gestation of pregnancy: Secondary | ICD-10-CM | POA: Diagnosis not present

## 2020-11-29 DIAGNOSIS — O9081 Anemia of the puerperium: Secondary | ICD-10-CM | POA: Diagnosis not present

## 2020-11-29 DIAGNOSIS — Z20822 Contact with and (suspected) exposure to covid-19: Secondary | ICD-10-CM | POA: Diagnosis present

## 2020-11-29 DIAGNOSIS — O99019 Anemia complicating pregnancy, unspecified trimester: Secondary | ICD-10-CM | POA: Diagnosis present

## 2020-11-29 LAB — AMNISURE RUPTURE OF MEMBRANE (ROM) NOT AT ARMC: Amnisure ROM: POSITIVE

## 2020-11-29 LAB — TYPE AND SCREEN
ABO/RH(D): O POS
Antibody Screen: NEGATIVE

## 2020-11-29 LAB — RESP PANEL BY RT-PCR (FLU A&B, COVID) ARPGX2
Influenza A by PCR: NEGATIVE
Influenza B by PCR: NEGATIVE
SARS Coronavirus 2 by RT PCR: NEGATIVE

## 2020-11-29 MED ORDER — OXYTOCIN BOLUS FROM INFUSION: 333.0000 mL | Freq: Once | INTRAVENOUS | Status: DC

## 2020-11-29 MED ORDER — ACETAMINOPHEN 325 MG PO TABS: 650.0000 mg | ORAL_TABLET | ORAL | Status: DC | PRN

## 2020-11-29 MED ORDER — LACTATED RINGERS IV SOLN: 500.0000 mL | INTRAVENOUS | Status: DC | PRN

## 2020-11-29 MED ORDER — FENTANYL CITRATE (PF) 100 MCG/2ML IJ SOLN
50.0000 ug | INTRAMUSCULAR | Status: DC | PRN
  Administered 2020-11-30: 50 ug via INTRAVENOUS
  Filled 2020-11-29: qty 2

## 2020-11-29 MED ORDER — SOD CITRATE-CITRIC ACID 500-334 MG/5ML PO SOLN
30.0000 mL | ORAL | Status: DC | PRN
  Administered 2020-11-30: 30 mL via ORAL
  Filled 2020-11-29: qty 15

## 2020-11-29 MED ORDER — LIDOCAINE HCL (PF) 1 % IJ SOLN: 30.0000 mL | INTRAMUSCULAR | Status: DC | PRN

## 2020-11-29 MED ORDER — OXYTOCIN-SODIUM CHLORIDE 30-0.9 UT/500ML-% IV SOLN: 2.5000 [IU]/h | INTRAVENOUS | Status: DC

## 2020-11-29 MED ORDER — LACTATED RINGERS IV SOLN: INTRAVENOUS | Status: DC

## 2020-11-29 MED ORDER — ONDANSETRON HCL 4 MG/2ML IJ SOLN: 4.0000 mg | Freq: Four times a day (QID) | INTRAMUSCULAR | Status: DC | PRN

## 2020-11-29 NOTE — H&P (Signed)
OB ADMISSION/ HISTORY & PHYSICAL:  Admission Date: 11/29/2020  7:19 PM  Admit Diagnosis: SROM  Sara Duran is a 25 y.o. female G1P0 [redacted]w[redacted]d presenting for LOF. Endorses active FM, denies vaginal bleeding. Pt was seen in MAU on 5/10 for LOF and ROM was ruled out. Pt states leaking occurred again at 1700. Amnisure positive in MAU. CNM detected the smell of cannabis, pt denies using, but states the smell may be coming from her guest. UDS to be collected.    History of current pregnancy: G1P0   Patient entered care with [facility] at 9+5 wks.   EDC [place] @ 9+5 wk Anatomy scan:  20+5 wks, complete w/ anterior placenta.   Last evaluation: 28+6  wks vertex/ anterior placenta/ AFI 21.6/ EFW/ 3# 2oz (66%) 1426 grams Significant prenatal events:  Patient Active Problem List   Diagnosis Date Noted  . Exercise-induced asthma 11/30/2020  . Depressive disorder 11/30/2020  . Anemia of pregnancy 11/30/2020  . Normal labor 11/29/2020    Prenatal Labs: ABO, Rh: --/--/O POS (05/11 2225) Antibody: NEG (05/11 2225) Rubella:   immune RPR:   NR HBsAg:   NR HIV:   NR GTT: passed 1 hr GBS:   neg on 11/14/20 GC/CHL: neg/neg Genetics: low-risk female Tdap/influenza vaccines: UTD for both   OB History  Gravida Para Term Preterm AB Living  1            SAB IAB Ectopic Multiple Live Births               # Outcome Date GA Lbr Len/2nd Weight Sex Delivery Anes PTL Lv  1 Current             Medical / Surgical History: Past medical history:  Past Medical History:  Diagnosis Date  . Allergy   . Anemia   . Asthma     Past surgical history:  Past Surgical History:  Procedure Laterality Date  . WISDOM TOOTH EXTRACTION     Family History:  Family History  Problem Relation Age of Onset  . Diabetes Paternal Grandfather   . Colon cancer Neg Hx     Social History:  reports that she has never smoked. She has never used smokeless tobacco. She reports previous alcohol use. She reports that she does not use  drugs.  Allergies: Patient has no known allergies.   Current Medications at time of admission:  Prior to Admission medications   Medication Sig Start Date End Date Taking? Authorizing Provider  acetaminophen (TYLENOL) 500 MG tablet Take 500 mg by mouth as needed.    [provider]  ondansetron (ZOFRAN ODT) 4 MG disintegrating tablet Take 1 tablet (4 mg total) by mouth every 8 (eight) hours as needed for nausea or vomiting. 05/16/19   Mannie Stabile, PA-C    Review of Systems: Constitutional: Negative   HENT: Negative   Eyes: Negative   Respiratory: Negative   Cardiovascular: Negative   Gastrointestinal: Negative  Genitourinary: beg for bloody show, pos for LOF   Musculoskeletal: Negative   Skin: Negative   Neurological: Negative   Endo/Heme/Allergies: Negative   Psychiatric/Behavioral: Negative    Physical Exam: VS: Blood pressure 113/72, pulse 82, temperature 98.7 F (37.1 C), temperature source Oral, resp. rate 16, height 5\' 8"  (1.727 m), weight 78.5 kg, SpO2 99 %. AAO x3, no signs of distress Cardiovascular: RRR Respiratory: Lung fields clear to ausculation GU/GI: Abdomen gravid, non-tender, non-distended, active FM, vertex, EFW 7# per Leopold's Extremities: no edema, negative  for pain, tenderness, and cords  Cervical exam:Dilation: 1 Effacement (%): 80 Station: -2 Exam by:: Lizbeth Bark, CNM FHR: baseline rate 145 / variability moderate / accelerations present / prolonged x2 decelerations TOCO: irreg   Prenatal Transfer Tool  Maternal Diabetes: No Genetic Screening: Normal Maternal Ultrasounds/Referrals: Normal Fetal Ultrasounds or other Referrals:  None Maternal Substance Abuse:  No Significant Maternal Medications:  None Significant Maternal Lab Results: Group B Strep negative    Assessment: 25 y.o. G1P0 [redacted]w[redacted]d SROM Prolonged decel in MAU, IUPC placed in L&D, unable to place FSE  Suspect marijuana use Latent stage of labor FHR category II,  resolved w/ IV fluid bolus and position change, now Cat I GBS neg Pain management plan: options discussed   Plan:  Admit to L&D Routine admission orders Epidural PRN UDS Dr Sallye Ober notified of admission and plan of care  Roma Schanz MSN, CNM 11/29/2020 10:37 PM

## 2020-11-29 NOTE — H&P (Incomplete)
OB ADMISSION/ HISTORY & PHYSICAL:  Admission Date: 11/29/2020  7:19 PM  Admit Diagnosis: SROM  Sara Duran is a 25 y.o. female G1P0 [redacted]w[redacted]d presenting for LOF. Endorses active FM, denies vaginal bleeding.  History of current pregnancy: G1P0   Patient entered care with [facility] at *** wks.   EDC [place] @ 9+5 wk Anatomy scan:  20+5 wks, complete w/ anterior placenta.   Antenatal testing: for *** started at *** weeks Last evaluation: ***  wks  Significant prenatal events: *** Patient Active Problem List   Diagnosis Date Noted  . Normal labor 11/29/2020    Prenatal Labs: ABO, Rh:   Antibody:   Rubella:   *** RPR:   *** HBsAg:   *** HIV:   *** GTT: *** GBS:   neg on 11/14/20 GC/CHL: *** Genetics: *** Tdap/influenza vaccines: ***   OB History  Gravida Para Term Preterm AB Living  1            SAB IAB Ectopic Multiple Live Births               # Outcome Date GA Lbr Len/2nd Weight Sex Delivery Anes PTL Lv  1 Current             Medical / Surgical History: Past medical history:  Past Medical History:  Diagnosis Date  . Allergy   . Anemia   . Asthma     Past surgical history:  Past Surgical History:  Procedure Laterality Date  . WISDOM TOOTH EXTRACTION     Family History:  Family History  Problem Relation Age of Onset  . Diabetes Paternal Grandfather   . Colon cancer Neg Hx     Social History:  reports that she has never smoked. She has never used smokeless tobacco. She reports previous alcohol use. She reports that she does not use drugs.  Allergies: Patient has no known allergies.   Current Medications at time of admission:  Prior to Admission medications   Medication Sig Start Date End Date Taking? Authorizing Provider  acetaminophen (TYLENOL) 500 MG tablet Take 500 mg by mouth as needed.    [provider]  ondansetron (ZOFRAN ODT) 4 MG disintegrating tablet Take 1 tablet (4 mg total) by mouth every 8 (eight) hours as needed for nausea or  vomiting. 05/16/19   Mannie Stabile, PA-C    Review of Systems: Constitutional: Negative   HENT: Negative   Eyes: Negative   Respiratory: Negative   Cardiovascular: Negative   Gastrointestinal: Negative  Genitourinary: *** for bloody show, *** for LOF   Musculoskeletal: Negative   Skin: Negative   Neurological: Negative   Endo/Heme/Allergies: Negative   Psychiatric/Behavioral: Negative    Physical Exam: VS: Blood pressure 113/72, pulse 82, temperature 98.7 F (37.1 C), temperature source Oral, resp. rate 16, height 5\' 8"  (1.727 m), weight 78.5 kg, SpO2 99 %. AAO x3, no signs of distress Cardiovascular: RRR Respiratory: Lung fields clear to ausculation GU/GI: Abdomen gravid, non-tender, non-distended, active FM, vertex, EFW *** per Leopold's Extremities: *** edema, negative for pain, tenderness, and cords  Cervical exam:  FHR: baseline rate *** / variability *** / accelerations *** / *** decelerations TOCO: ***   Prenatal Transfer Tool  Maternal Diabetes: {Maternal Diabetes:3043596} Genetic Screening: {Genetic Screening:20205} Maternal Ultrasounds/Referrals: {Maternal Ultrasounds / Referrals:20211} Fetal Ultrasounds or other Referrals:  {Fetal Ultrasounds or Other Referrals:20213} Maternal Substance Abuse:  {Maternal Substance Abuse:20223} Significant Maternal Medications:  {Significant Maternal Meds:20233} Significant Maternal Lab Results: {Significant Maternal Lab  Results:20235}    Assessment: 25 y.o. G1P0 [redacted]w[redacted]d  *** stage of labor FHR category *** GBS *** Pain management plan: ***   Plan:  Admit to L&D Routine admission orders Epidural PRN *** Dr Marland Kitchen notified of admission and plan of care  Roma Schanz MSN, CNM 11/29/2020 10:37 PM

## 2020-11-29 NOTE — MAU Provider Note (Signed)
S: Ms. Sara Duran is a 25 y.o. G1P0 at [redacted]w[redacted]d  who presents to MAU today complaining of leaking of fluid since yesterday.  She denies vaginal bleeding. She endorses contractions. She reports normal fetal movement.    O: BP 112/68 (BP Location: Right Arm)   Pulse (!) 103   Temp 98.3 F (36.8 C) (Oral)   Resp 16   Ht 5\' 8"  (1.727 m)   Wt 78.5 kg   SpO2 99% Comment: room air  BMI 26.30 kg/m  GENERAL: Well-developed, well-nourished female in no acute distress.  HEAD: Normocephalic, atraumatic.  CHEST: Normal effort of breathing, regular heart rate ABDOMEN: Soft, nontender, gravid PELVIC: Normal external female genitalia. Amnisure by RN blind swab.   Cervical exam:   Deferred   Fetal Monitoring: REACTIVE NST - FHR: 135 bpm / moderate variability / accels present / decels absent / TOCO: regular every 7-12 mins  Patient had a 6 minute decel @ 2100 - IV started and position change recovered FHR to baseline  Results for orders placed or performed during the hospital encounter of 11/29/20 (from the past 24 hour(s))  Amnisure rupture of membrane (rom)not at [facility]     Status: None   Collection Time: 11/29/20  8:43 PM  Result Value Ref Range   Amnisure ROM POSITIVE     A: SIUP at [redacted]w[redacted]d  SROM  P: Report given to RN to contact [facility] on call provider for further instructions  [redacted]w[redacted]d, CNM 11/29/2020, 8:37 PM

## 2020-11-29 NOTE — MAU Note (Signed)
Pt returns to MAU with continuing gushes of fluid that goes down her leg. Denies VB. Is unsure if having contractions. +FM

## 2020-11-30 ENCOUNTER — Inpatient Hospital Stay (HOSPITAL_COMMUNITY): Payer: BC Managed Care – PPO | Admitting: Anesthesiology

## 2020-11-30 ENCOUNTER — Encounter (HOSPITAL_COMMUNITY): Payer: Self-pay | Admitting: Obstetrics & Gynecology

## 2020-11-30 ENCOUNTER — Encounter (HOSPITAL_COMMUNITY)
Admission: AD | Disposition: A | Discharge status: Home / Self Care | Payer: Self-pay | Source: Home / Self Care | Attending: Obstetrics & Gynecology

## 2020-11-30 DIAGNOSIS — F329 Major depressive disorder, single episode, unspecified: Secondary | ICD-10-CM | POA: Diagnosis not present

## 2020-11-30 DIAGNOSIS — O99019 Anemia complicating pregnancy, unspecified trimester: Secondary | ICD-10-CM | POA: Diagnosis present

## 2020-11-30 DIAGNOSIS — J45990 Exercise induced bronchospasm: Secondary | ICD-10-CM | POA: Diagnosis not present

## 2020-11-30 DIAGNOSIS — F32A Depression, unspecified: Secondary | ICD-10-CM | POA: Diagnosis not present

## 2020-11-30 LAB — CBC
HCT: 28.8 % — ABNORMAL LOW (ref 36.0–46.0)
Hemoglobin: 9.7 g/dL — ABNORMAL LOW (ref 12.0–15.0)
MCH: 32 pg (ref 26.0–34.0)
MCHC: 33.7 g/dL (ref 30.0–36.0)
MCV: 95 fL (ref 80.0–100.0)
Platelets: 264 10*3/uL (ref 150–400)
RBC: 3.03 MIL/uL — ABNORMAL LOW (ref 3.87–5.11)
RDW: 13.1 % (ref 11.5–15.5)
WBC: 10 10*3/uL (ref 4.0–10.5)
nRBC: 0 % (ref 0.0–0.2)

## 2020-11-30 LAB — RAPID URINE DRUG SCREEN, HOSP PERFORMED
Amphetamines: NOT DETECTED
Barbiturates: NOT DETECTED
Benzodiazepines: NOT DETECTED
Cocaine: NOT DETECTED
Opiates: NOT DETECTED
Tetrahydrocannabinol: NOT DETECTED

## 2020-11-30 LAB — RPR: RPR Ser Ql: NONREACTIVE

## 2020-11-30 SURGERY — Surgical Case
Anesthesia: Spinal

## 2020-11-30 MED ORDER — DEXAMETHASONE SODIUM PHOSPHATE 4 MG/ML IJ SOLN
INTRAMUSCULAR | Status: AC
  Filled 2020-11-30: qty 1

## 2020-11-30 MED ORDER — MENTHOL 3 MG MT LOZG: 1.0000 | LOZENGE | OROMUCOSAL | Status: DC | PRN

## 2020-11-30 MED ORDER — NALBUPHINE HCL 10 MG/ML IJ SOLN: 5.0000 mg | Freq: Once | INTRAMUSCULAR | Status: DC | PRN

## 2020-11-30 MED ORDER — KETOROLAC TROMETHAMINE 30 MG/ML IJ SOLN
INTRAMUSCULAR | Status: AC
  Filled 2020-11-30: qty 1

## 2020-11-30 MED ORDER — CEFAZOLIN SODIUM-DEXTROSE 2-4 GM/100ML-% IV SOLN
2.0000 g | INTRAVENOUS | Status: AC
  Administered 2020-11-30: 2 g via INTRAVENOUS

## 2020-11-30 MED ORDER — ONDANSETRON HCL 4 MG/2ML IJ SOLN: 4.0000 mg | Freq: Three times a day (TID) | INTRAMUSCULAR | Status: DC | PRN

## 2020-11-30 MED ORDER — MORPHINE SULFATE (PF) 0.5 MG/ML IJ SOLN
INTRAMUSCULAR | Status: DC | PRN
  Administered 2020-11-30: .15 mg via INTRATHECAL

## 2020-11-30 MED ORDER — FENTANYL CITRATE (PF) 100 MCG/2ML IJ SOLN
INTRAMUSCULAR | Status: AC
  Filled 2020-11-30: qty 2

## 2020-11-30 MED ORDER — NALBUPHINE HCL 10 MG/ML IJ SOLN: 5.0000 mg | INTRAMUSCULAR | Status: DC | PRN

## 2020-11-30 MED ORDER — OXYTOCIN-SODIUM CHLORIDE 30-0.9 UT/500ML-% IV SOLN
INTRAVENOUS | Status: AC
  Filled 2020-11-30: qty 500

## 2020-11-30 MED ORDER — LACTATED RINGERS IV SOLN: INTRAVENOUS | Status: DC

## 2020-11-30 MED ORDER — DIPHENHYDRAMINE HCL 50 MG/ML IJ SOLN: 12.5000 mg | Freq: Four times a day (QID) | INTRAMUSCULAR | Status: DC | PRN

## 2020-11-30 MED ORDER — DIBUCAINE (PERIANAL) 1 % EX OINT: 1.0000 "application " | TOPICAL_OINTMENT | CUTANEOUS | Status: DC | PRN

## 2020-11-30 MED ORDER — NALBUPHINE HCL 10 MG/ML IJ SOLN
5.0000 mg | Freq: Once | INTRAMUSCULAR | Status: DC | PRN
Start: 2020-11-30 — End: 2020-12-02

## 2020-11-30 MED ORDER — FERROUS SULFATE 325 (65 FE) MG PO TABS
325.0000 mg | ORAL_TABLET | Freq: Every day | ORAL | Status: DC
  Administered 2020-12-01: 325 mg via ORAL
  Filled 2020-11-30 (×2): qty 1

## 2020-11-30 MED ORDER — ACETAMINOPHEN 160 MG/5ML PO SOLN: 1000.0000 mg | Freq: Once | ORAL | Status: DC

## 2020-11-30 MED ORDER — OXYCODONE-ACETAMINOPHEN 5-325 MG PO TABS: 2.0000 | ORAL_TABLET | ORAL | Status: DC | PRN

## 2020-11-30 MED ORDER — KETOROLAC TROMETHAMINE 30 MG/ML IJ SOLN: 30.0000 mg | Freq: Four times a day (QID) | INTRAMUSCULAR | Status: AC | PRN

## 2020-11-30 MED ORDER — ONDANSETRON HCL 4 MG/2ML IJ SOLN
INTRAMUSCULAR | Status: AC
  Filled 2020-11-30: qty 2

## 2020-11-30 MED ORDER — PHENYLEPHRINE HCL-NACL 20-0.9 MG/250ML-% IV SOLN
INTRAVENOUS | Status: AC
  Filled 2020-11-30: qty 250

## 2020-11-30 MED ORDER — FENTANYL CITRATE (PF) 100 MCG/2ML IJ SOLN
INTRAMUSCULAR | Status: DC | PRN
  Administered 2020-11-30: 15 ug via INTRATHECAL

## 2020-11-30 MED ORDER — MORPHINE SULFATE (PF) 0.5 MG/ML IJ SOLN
INTRAMUSCULAR | Status: AC
  Filled 2020-11-30: qty 10

## 2020-11-30 MED ORDER — OXYCODONE-ACETAMINOPHEN 5-325 MG PO TABS
1.0000 | ORAL_TABLET | ORAL | Status: DC | PRN
Start: 2020-11-30 — End: 2020-12-01
  Administered 2020-12-01: 1 via ORAL
  Filled 2020-11-30: qty 1

## 2020-11-30 MED ORDER — COCONUT OIL OIL
1.0000 "application " | TOPICAL_OIL | Status: DC | PRN
  Administered 2020-12-01: 1 via TOPICAL

## 2020-11-30 MED ORDER — SCOPOLAMINE 1 MG/3DAYS TD PT72
1.0000 | MEDICATED_PATCH | TRANSDERMAL | Status: DC
Start: 2020-11-30 — End: 2020-12-02
  Administered 2020-11-30: 1.5 mg via TRANSDERMAL
  Filled 2020-11-30: qty 1

## 2020-11-30 MED ORDER — ACETAMINOPHEN 500 MG PO TABS
1000.0000 mg | ORAL_TABLET | Freq: Four times a day (QID) | ORAL | Status: AC
  Administered 2020-11-30 – 2020-12-01 (×4): 1000 mg via ORAL
  Filled 2020-11-30 (×5): qty 2

## 2020-11-30 MED ORDER — PROMETHAZINE HCL 25 MG/ML IJ SOLN: 6.2500 mg | INTRAMUSCULAR | Status: DC | PRN

## 2020-11-30 MED ORDER — NALOXONE HCL 4 MG/10ML IJ SOLN
1.0000 ug/kg/h | INTRAVENOUS | Status: DC | PRN
  Filled 2020-11-30: qty 5

## 2020-11-30 MED ORDER — ONDANSETRON HCL 4 MG/2ML IJ SOLN
INTRAMUSCULAR | Status: DC | PRN
  Administered 2020-11-30: 4 mg via INTRAVENOUS

## 2020-11-30 MED ORDER — ACETAMINOPHEN 500 MG PO TABS: 1000.0000 mg | ORAL_TABLET | Freq: Once | ORAL | Status: DC

## 2020-11-30 MED ORDER — WITCH HAZEL-GLYCERIN EX PADS: 1.0000 "application " | MEDICATED_PAD | CUTANEOUS | Status: DC | PRN

## 2020-11-30 MED ORDER — BUPIVACAINE IN DEXTROSE 0.75-8.25 % IT SOLN
INTRATHECAL | Status: DC | PRN
  Administered 2020-11-30: 1.6 mL via INTRATHECAL

## 2020-11-30 MED ORDER — OXYTOCIN-SODIUM CHLORIDE 30-0.9 UT/500ML-% IV SOLN
INTRAVENOUS | Status: DC | PRN
  Administered 2020-11-30: 400 mL via INTRAVENOUS

## 2020-11-30 MED ORDER — KETOROLAC TROMETHAMINE 30 MG/ML IJ SOLN
30.0000 mg | Freq: Once | INTRAMUSCULAR | Status: AC
  Administered 2020-11-30: 30 mg via INTRAVENOUS

## 2020-11-30 MED ORDER — NALOXONE HCL 0.4 MG/ML IJ SOLN: 0.4000 mg | INTRAMUSCULAR | Status: DC | PRN

## 2020-11-30 MED ORDER — PHENYLEPHRINE HCL-NACL 20-0.9 MG/250ML-% IV SOLN
INTRAVENOUS | Status: DC | PRN
  Administered 2020-11-30: 60 ug/min via INTRAVENOUS

## 2020-11-30 MED ORDER — SODIUM CHLORIDE 0.9 % IR SOLN
Status: DC | PRN
  Administered 2020-11-30: 1

## 2020-11-30 MED ORDER — ZOLPIDEM TARTRATE 5 MG PO TABS: 5.0000 mg | ORAL_TABLET | Freq: Every evening | ORAL | Status: DC | PRN

## 2020-11-30 MED ORDER — SODIUM CHLORIDE 0.9% FLUSH: 3.0000 mL | INTRAVENOUS | Status: DC | PRN

## 2020-11-30 MED ORDER — SIMETHICONE 80 MG PO CHEW: 80.0000 mg | CHEWABLE_TABLET | ORAL | Status: DC | PRN

## 2020-11-30 MED ORDER — IBUPROFEN 600 MG PO TABS
600.0000 mg | ORAL_TABLET | Freq: Four times a day (QID) | ORAL | Status: DC
  Administered 2020-12-01 – 2020-12-02 (×5): 600 mg via ORAL
  Filled 2020-11-30 (×5): qty 1

## 2020-11-30 MED ORDER — DIPHENHYDRAMINE HCL 25 MG PO CAPS: 25.0000 mg | ORAL_CAPSULE | Freq: Four times a day (QID) | ORAL | Status: DC | PRN

## 2020-11-30 MED ORDER — PRENATAL MULTIVITAMIN CH
1.0000 | ORAL_TABLET | Freq: Every day | ORAL | Status: DC
  Administered 2020-12-01 – 2020-12-02 (×2): 1 via ORAL
  Filled 2020-11-30 (×3): qty 1

## 2020-11-30 MED ORDER — SOD CITRATE-CITRIC ACID 500-334 MG/5ML PO SOLN
30.0000 mL | ORAL | Status: AC
  Administered 2020-11-30: 30 mL via ORAL
  Filled 2020-11-30: qty 15

## 2020-11-30 MED ORDER — CEFAZOLIN SODIUM-DEXTROSE 2-4 GM/100ML-% IV SOLN
INTRAVENOUS | Status: AC
  Filled 2020-11-30: qty 100

## 2020-11-30 MED ORDER — DEXAMETHASONE SODIUM PHOSPHATE 4 MG/ML IJ SOLN
INTRAMUSCULAR | Status: DC | PRN
  Administered 2020-11-30: 4 mg via INTRAVENOUS

## 2020-11-30 MED ORDER — FENTANYL CITRATE (PF) 100 MCG/2ML IJ SOLN
25.0000 ug | INTRAMUSCULAR | Status: DC | PRN
  Administered 2020-11-30: 50 ug via INTRAVENOUS

## 2020-11-30 MED ORDER — SENNOSIDES-DOCUSATE SODIUM 8.6-50 MG PO TABS
2.0000 | ORAL_TABLET | Freq: Every day | ORAL | Status: DC
  Administered 2020-12-01 – 2020-12-02 (×2): 2 via ORAL
  Filled 2020-11-30 (×2): qty 2

## 2020-11-30 MED ORDER — LACTATED RINGERS IV SOLN: INTRAVENOUS | Status: DC | PRN

## 2020-11-30 MED ORDER — KETOROLAC TROMETHAMINE 30 MG/ML IJ SOLN
30.0000 mg | Freq: Four times a day (QID) | INTRAMUSCULAR | Status: AC
  Administered 2020-11-30 – 2020-12-01 (×4): 30 mg via INTRAVENOUS
  Filled 2020-11-30 (×4): qty 1

## 2020-11-30 MED ORDER — OXYTOCIN-SODIUM CHLORIDE 30-0.9 UT/500ML-% IV SOLN: 2.5000 [IU]/h | INTRAVENOUS | Status: AC

## 2020-11-30 SURGICAL SUPPLY — 40 items
BENZOIN TINCTURE PRP APPL 2/3 (GAUZE/BANDAGES/DRESSINGS) ×1 IMPLANT
CHLORAPREP W/TINT 26ML (MISCELLANEOUS) ×2 IMPLANT
CLAMP CORD UMBIL (MISCELLANEOUS) IMPLANT
CLOSURE STERI STRIP 1/2 X4 (GAUZE/BANDAGES/DRESSINGS) ×1 IMPLANT
CLOTH BEACON ORANGE TIMEOUT ST (SAFETY) ×2 IMPLANT
DERMABOND ADVANCED (GAUZE/BANDAGES/DRESSINGS)
DERMABOND ADVANCED .7 DNX12 (GAUZE/BANDAGES/DRESSINGS) IMPLANT
DRAPE C SECTION CLR SCREEN (DRAPES) ×2 IMPLANT
DRSG OPSITE POSTOP 4X10 (GAUZE/BANDAGES/DRESSINGS) ×2 IMPLANT
ELECT REM PT RETURN 9FT ADLT (ELECTROSURGICAL) ×2
ELECTRODE REM PT RTRN 9FT ADLT (ELECTROSURGICAL) ×1 IMPLANT
EXTRACTOR VACUUM M CUP 4 TUBE (SUCTIONS) ×1 IMPLANT
GLOVE BIOGEL PI IND STRL 7.0 (GLOVE) ×2 IMPLANT
GLOVE BIOGEL PI INDICATOR 7.0 (GLOVE) ×2
GLOVE SURG SS PI 6.5 STRL IVOR (GLOVE) ×2 IMPLANT
GOWN STRL REUS W/TWL LRG LVL3 (GOWN DISPOSABLE) ×4 IMPLANT
KIT ABG SYR 3ML LUER SLIP (SYRINGE) IMPLANT
NDL HYPO 25X5/8 SAFETYGLIDE (NEEDLE) IMPLANT
NEEDLE HYPO 25X5/8 SAFETYGLIDE (NEEDLE) IMPLANT
NS IRRIG 1000ML POUR BTL (IV SOLUTION) ×2 IMPLANT
PACK C SECTION WH (CUSTOM PROCEDURE TRAY) ×2 IMPLANT
PAD ABD 7.5X8 STRL (GAUZE/BANDAGES/DRESSINGS) ×1 IMPLANT
PAD OB MATERNITY 4.3X12.25 (PERSONAL CARE ITEMS) ×2 IMPLANT
PENCIL SMOKE EVAC W/HOLSTER (ELECTROSURGICAL) ×2 IMPLANT
RTRCTR C-SECT PINK 25CM LRG (MISCELLANEOUS) ×2 IMPLANT
SPONGE GAUZE 4X4 12PLY STER LF (GAUZE/BANDAGES/DRESSINGS) ×2 IMPLANT
SUT CHROMIC 1 CTX 36 (SUTURE) IMPLANT
SUT CHROMIC 2 0 CT 1 (SUTURE) ×2 IMPLANT
SUT MON AB 4-0 PS1 27 (SUTURE) ×2 IMPLANT
SUT PLAIN 1 NONE 54 (SUTURE) IMPLANT
SUT PLAIN 2 0 (SUTURE)
SUT PLAIN 2 0 XLH (SUTURE) IMPLANT
SUT PLAIN ABS 2-0 CT1 27XMFL (SUTURE) IMPLANT
SUT VIC AB 0 CTX 36 (SUTURE) ×1
SUT VIC AB 0 CTX36XBRD ANBCTRL (SUTURE) ×1 IMPLANT
SUT VIC AB 1 CTX 36 (SUTURE) ×2
SUT VIC AB 1 CTX36XBRD ANBCTRL (SUTURE) ×2 IMPLANT
TOWEL OR 17X24 6PK STRL BLUE (TOWEL DISPOSABLE) ×2 IMPLANT
TRAY FOLEY W/BAG SLVR 14FR LF (SET/KITS/TRAYS/PACK) ×2 IMPLANT
WATER STERILE IRR 1000ML POUR (IV SOLUTION) ×3 IMPLANT

## 2020-11-30 NOTE — Anesthesia Preprocedure Evaluation (Signed)
Anesthesia Evaluation  Patient identified by MRN, date of birth, ID band Patient awake    Reviewed: Allergy & Precautions, NPO status , Patient's Chart, lab work & pertinent test results  History of Anesthesia Complications Negative for: history of anesthetic complications  Airway Mallampati: II  TM Distance: >3 FB Neck ROM: Full    Dental no notable dental hx.    Pulmonary asthma ,    Pulmonary exam normal        Cardiovascular negative cardio ROS Normal cardiovascular exam     Neuro/Psych negative neurological ROS  negative psych ROS   GI/Hepatic negative GI ROS, Neg liver ROS,   Endo/Other  negative endocrine ROS  Renal/GU negative Renal ROS  negative genitourinary   Musculoskeletal negative musculoskeletal ROS (+)   Abdominal   Peds  Hematology  (+) anemia , Hgb 9.7   Anesthesia Other Findings Day of surgery medications reviewed with patient.  Reproductive/Obstetrics (+) Pregnancy                             Anesthesia Physical Anesthesia Plan  ASA: II and emergent  Anesthesia Plan: Spinal   Post-op Pain Management:    Induction:   PONV Risk Score and Plan: 4 or greater and Treatment may vary due to age or medical condition, Ondansetron and Dexamethasone  Airway Management Planned: Natural Airway  Additional Equipment: None  Intra-op Plan:   Post-operative Plan:   Informed Consent: I have reviewed the patients History and Physical, chart, labs and discussed the procedure including the risks, benefits and alternatives for the proposed anesthesia with the patient or authorized representative who has indicated his/her understanding and acceptance.       Plan Discussed with: CRNA  Anesthesia Plan Comments: (Urgent C/S for NRFHT. Stephannie Peters, MD)        Anesthesia Quick Evaluation

## 2020-11-30 NOTE — Op Note (Signed)
Patient: Sara Duran DOB: [birth year]  MRN:  [id number] DATE OF SURGERY: 11/30/2020  PREOP DIAGNOSIS:  1. 25 y/o G1P0 at [redacted] week EGA intrauterine pregnancy who presented with spontaneous rupture of membranes.  2. Recurrent late and prolonged decelerations despite intrauterine resuscitation.   3. Remote from delivery at 2 cm dilation.   POSTOP DIAGNOSIS: Same as above.  PROCEDURE: Vacuum assisted Primary low uterine segment transverse cesarean section via Pfannenstiel incision.     SURGEON: Dr.  Hoover Browns  ASSISTANT: None  ATTENDING ATTESTATION: I was present and scrubbed and performed the procedure.  ANESTHESIA: Spinal  COMPLICATIONS: None  FINDINGS: Viable female infant in cephalic presentation, ROP, weight pending, Apgar scores of 8 and 9. Normal uterus and fallopian tubes and ovaries bilaterally.    EBL:  338 cc  IV FLUID:  2500 cc LR   URINE OUTPUT: 900 cc clear urine  INDICATIONS:  25 y/o G1P0 who presented with spontaneous rupture of membranes. Recurrent late and prolonged decelerations were noted on EFM that continued despite intrauterine resuscitation.  A cesarean delivery was called for abnormal fetal heart tracing and remote from delivery.  She was consented for the procedure after explaining risks, benefits and alternatives of the procedure.    PROCEDURE:  She was taken to the operating room where her spinal anesthesia was found to be adequate. She was prepped and draped in the usual sterile fashion and a Foley catheter was placed. She received 2 g of IV Ancef preoperatively. A Pfannenstiel incision was made with the scalpel and the incision extended through the subcutaneous layer and also the fascia with the bovie. Small perforators in the subcutaneous layer were contained with the Bovie. The fascia was nicked in the midline and then was further separated from the rectus muscles bilaterally using Mayo scissors. Kochers were placed inferiorly and then superiorly to allow  further separation of fascia from the rectus muscles.  The peritoneal cavity was entered bluntly with the fingers. The Alexis retractor was placed in. The bladder flap was created using Metzenbaum scissors.   The uterus was incised with a scalpel and the incision extended bluntly bilaterally with fingers and bandage scisors. Moderate clear amniotic fluid was noted.  The head was wedged in the pelvis and not easily delivered, therefore a vacuum was applied to deliver the head with one pull to the recommended pressure zone, no pop off, delivery effected within 10 seconds of application.   Then the rest of the body was then delivered with abdominal pressure.  She delivered a viable female infant, apgar scores 8, 9.  The cord was clamped and cut after half a minute as I was concerned about baby's tone.  Cord blood was collected.  The uterus was not exteriorized.  The edges of the uterus was grasped with T clamps and ring forceps.  The placenta was delivered with gentle traction on the umbilical cord.  The uterus was cleared of clots and debris with a lap.  The uterine incision was closed with #1 Vicryl in a running locked stitch. An imbricating layer of the same stitch was placed over the initial closure.  Irrigation was applied and suctioned out. Excellent hemostasis was noted over the incision.  The muscles and peritoneum were then reapproximated using chromic suture.  A small area that bled on the upper  left side of muscle was contained with the bovie.  Fascia was closed using 0 Vicryl in a running stitch. The subcutaneous layer was irrigated and suctioned out. Small perforators  were contained with the bovie.  The subcutaneous layer was closed using 1-0 plain in interrupted stitches. The skin was closed using 4-0 Vicryl on the keith needle. Benzocaine and steri strips were applied.  Honeycomb and pressure dressings were then applied. The patient was then cleaned and she was taken to the recovery room with her baby  in stable conditions.   SPECIMENS:  1. Placenta to pathology 2. Umbilical cord blood to lab.   DISPOSITION: TO PACU, STABLE.  Dr. Sallye Ober.   11/30/2020.

## 2020-11-30 NOTE — Progress Notes (Addendum)
Pt had a prolonged decel in MAU and two prolonged decels since arriving on L&D. FHR ascends to baseline and return of moderate variability with accels occurs w/ position change. Discussed potential for primary C/S for fetal intolerance to labor and pt agrees. Dr. Sallye Ober notified of each event and FHR response to interventions. Plan developed w/ Dr. Sallye Ober and discussed w/ pt.   Rhea Pink, MSN, CNM 11/30/2020 1:01 AM

## 2020-11-30 NOTE — Transfer of Care (Signed)
Immediate Anesthesia Transfer of Care Note  Patient: Sara Duran  Procedure(s) Performed: CESAREAN SECTION (N/A )  Patient Location: PACU  Anesthesia Type:Spinal  Level of Consciousness: awake, alert  and patient cooperative  Airway & Oxygen Therapy: Patient Spontanous Breathing  Post-op Assessment: Report given to RN and Post -op Vital signs reviewed and stable  Post vital signs: Reviewed and stable  Last Vitals:  Vitals Value Taken Time  BP    Temp    Pulse    Resp    SpO2      Last Pain:  Vitals:   11/30/20 0315  TempSrc:   PainSc: 8          Complications: No complications documented.

## 2020-11-30 NOTE — Social Work (Signed)
CSW received consult for hx of Depression.  CSW met with MOB to offer support and complete assessment.     CSW introduced self and role. CSW observed MOB holding infant 'Xavier.' CSW informed MOB of reason for consult. MOB pleasant and flat in affect. MOB reported she experienced depression for the first time during pregnancy. MOB stated she was experiencing stress and overwhelmed due to school. CSW asked MOB how she coped with the feelings. MOB disclosed she spoke with her professors who agreed to make some adjustments to her school work. MOB reported she has never been on medication or attended therapy to treat depressive symptoms. MOB shared she would be open to therapy. MOB identified her family as primary supports and denies any current SI, HI or being involved in DV. MOB stated she is currently dong well and did not display any acute mental health symptoms.   CSW provided education regarding the baby blues period versus PPD and provided resources. CSW provided the New Mom Checklist and encouraged MOB to self evaluate and contact a medical professional if symptoms are noted at any time.  CSW provided review of Sudden Infant Death Syndrome (SIDS) precautions. MOB reported she has all essentials for infant, including a bassinet and car seat. MOB identified [facility] for follow-up care and denies any barriers. MOB denies having any additional questions or needs at this time.   CSW identifies no further need for intervention and no barriers to discharge at this time.  Chaney Johnson, LCSWA Clinical Social Work [facility] [phone]    

## 2020-11-30 NOTE — Progress Notes (Signed)
Late deceleration. Dr. Sallye Ober reviewed fetal surveillance and decision for primary cesarean section called. Pt notified, R/B/I discussed, and pt agrees to procedure.   Rhea Pink, MSN, CNM 11/30/2020 1:22 AM

## 2020-11-30 NOTE — Anesthesia Postprocedure Evaluation (Signed)
Anesthesia Post Note  Patient: Sara Duran  Procedure(s) Performed: CESAREAN SECTION (N/A )     Patient location during evaluation: PACU Anesthesia Type: Spinal Level of consciousness: awake and alert and oriented Pain management: pain level controlled Vital Signs Assessment: post-procedure vital signs reviewed and stable Respiratory status: spontaneous breathing, nonlabored ventilation and respiratory function stable Cardiovascular status: blood pressure returned to baseline Postop Assessment: no apparent nausea or vomiting, spinal receding, no headache and no backache Anesthetic complications: no   No complications documented.  Last Vitals:  Vitals:   11/30/20 0615 11/30/20 0630  BP: 108/79 115/75  Pulse:    Resp: 16   Temp: 36.9 C   SpO2:      Last Pain:  Vitals:   11/30/20 0630  TempSrc:   PainSc: 6    Pain Goal:      LLE Sensation: Numbness (11/30/20 0546)   RLE Sensation: Numbness (11/30/20 0546)     Epidural/Spinal Function Cutaneous sensation: Tingles (11/30/20 0630), Patient able to flex knees: Yes (11/30/20 0630), Patient able to lift hips off bed: Yes (11/30/20 0630), Back pain beyond tenderness at insertion site: No (11/30/20 0630), Progressively worsening motor and/or sensory loss: No (11/30/20 0630), Bowel and/or bladder incontinence post epidural: No (11/30/20 0630)  Kaylyn Layer

## 2020-11-30 NOTE — Progress Notes (Signed)
Sara Duran is a 25 y.o. G1P0 at [redacted]w[redacted]d admitted for spontaneous rupture of membranes.   Subjective: Patient feels strong contractions.   Objective: BP 113/70   Pulse 84   Temp 98.4 F (36.9 C) (Oral)   Resp 16   Ht 5\' 8"  (1.727 m)   Wt 78.5 kg   SpO2 99% Comment: room air  BMI 26.30 kg/m  No intake/output data recorded. No intake/output data recorded.  FHT:  FHR: 120 bpm, variability: moderate,  accelerations:  Present,  decelerations:  Present Recurrent late and recurrent prolonged decelerations. Decelerations to 90s for 3 to 6 mins.  UC:   irregular, every 8 to 10 minutes SVE:   Dilation: 2 Effacement (%): 80 Station: -2 Exam by:: [email or web address], CNM  Labs: Lab Results  Component Value Date   WBC 10.0 11/29/2020   HGB 9.7 (L) 11/29/2020   HCT 28.8 (L) 11/29/2020   MCV 95.0 11/29/2020   PLT 264 11/29/2020   Assessment / Plan: 25 y/o G1P0 at [redacted] weeks EGA with SROM and recurrent late and prolonged decelerations, remote from delivery  Labor: Management options discussed with recommendations for a cesarean delivery and patient agrees.  We discussed with the patient risks, benefits and alternative of cesarean section to include but not limited to risks of bleeding, infection, damage to organs and possible need of additional surgeries for these complications.    Fetal Wellbeing:  Category II Pain Control:  For spinal anesthesia.  I/D:  Ancef for preop prophylaxis.  Anticipated MOD:  Cesarean section.  30. MD.  11/30/2020, 1:51 AM

## 2020-11-30 NOTE — Anesthesia Procedure Notes (Signed)
Spinal  Patient location during procedure: OR Start time: 11/30/2020 4:01 AM End time: 11/30/2020 4:03 AM Reason for block: surgical anesthesia Staffing Performed: anesthesiologist  Anesthesiologist: Kaylyn Layer, MD Preanesthetic Checklist Completed: patient identified, IV checked, risks and benefits discussed, monitors and equipment checked, pre-op evaluation and timeout performed Spinal Block Patient position: sitting Prep: DuraPrep and site prepped and draped Patient monitoring: heart rate, continuous pulse ox and blood pressure Approach: midline Location: L3-4 Injection technique: single-shot Needle Needle type: Pencan  Needle gauge: 24 G Needle length: 10 cm Assessment Sensory level: T4 Events: CSF return Additional Notes Risks, benefits, and alternative discussed. Patient gave consent to procedure. Prepped and draped in sitting position. Clear CSF obtained after one needle pass. Positive terminal aspiration. No pain or paraesthesias with injection. Patient tolerated procedure well. Vital signs stable. Amalia Greenhouse, MD

## 2020-11-30 NOTE — Lactation Note (Signed)
This note was copied from a baby's chart. Lactation Consultation Note  Patient Name: Sara Duran Today's Date: 11/30/2020 Reason for consult: Initial assessment Age:23 hours   Mom was very quiet throughout consult.  Hand exp. Reviewed with mom.  Discussed STS, feeding 8-12 X in 24 hours, and feeding cues.   Infant cueing and oral assessment done with gloved finger.  Lingual frenulum visible.  Infant clamped down on finger but did begin sucking pattern after a minutes of suck training.  Pillows used and infant was placed in football hold.  Infant had a narrow latch and mom felt pinching.  She was taught how to break the latch to relatch, support her breast and infant's neck and back when latching.  Infant was placed in laid back position and latched and rhythmic sucking noted; no swallows.    Mom used breast massage.  Infant fed 9 minutes.  Nipple was slightly pinched when infant came off the breast.   Infant placed STS,  Rooted,  and was placed on the other breast in laid back position.  Mom felt more tugging with this latch.    Plan: mom will hand express and do STS with infant.  If pain is felt during feeding, mom will call out for assistance with positioning and latching.  Brochure provided for mom. She is aware of OP appt., phone line, and support groups.   Maternal Data Has patient been taught Hand Expression?: Yes  Feeding Mother's Current Feeding Choice: Breast Milk  LATCH Score Latch: Repeated attempts needed to sustain latch, nipple held in mouth throughout feeding, stimulation needed to elicit sucking reflex.  Audible Swallowing: None  Type of Nipple: Everted at rest and after stimulation  Comfort (Breast/Nipple): Filling, red/small blisters or bruises, mild/mod discomfort (mild discomfort, pinching feeling when infant latched)  Hold (Positioning): Assistance needed to correctly position infant at breast and maintain latch.  LATCH Score: 5   Lactation Tools  Discussed/Used    Interventions Interventions: Breast feeding basics reviewed;Breast compression;Assisted with latch;Hand express  Discharge WIC Program: No  Consult Status Consult Status: Follow-up Date: 12/01/20 Follow-up type: In-patient    Maryruth Hancock [facility] 11/30/2020, 3:34 PM

## 2020-12-01 LAB — CBC
HCT: 23.3 % — ABNORMAL LOW (ref 36.0–46.0)
Hemoglobin: 8 g/dL — ABNORMAL LOW (ref 12.0–15.0)
MCH: 32.8 pg (ref 26.0–34.0)
MCHC: 34.3 g/dL (ref 30.0–36.0)
MCV: 95.5 fL (ref 80.0–100.0)
Platelets: 204 10*3/uL (ref 150–400)
RBC: 2.44 MIL/uL — ABNORMAL LOW (ref 3.87–5.11)
RDW: 13.1 % (ref 11.5–15.5)
WBC: 14.1 10*3/uL — ABNORMAL HIGH (ref 4.0–10.5)
nRBC: 0 % (ref 0.0–0.2)

## 2020-12-01 MED ORDER — OXYCODONE HCL 5 MG PO TABS: 10.0000 mg | ORAL_TABLET | ORAL | Status: DC | PRN

## 2020-12-01 MED ORDER — OXYCODONE HCL 5 MG PO TABS: 5.0000 mg | ORAL_TABLET | ORAL | Status: DC | PRN

## 2020-12-01 MED ORDER — ACETAMINOPHEN 500 MG PO TABS
1000.0000 mg | ORAL_TABLET | Freq: Four times a day (QID) | ORAL | Status: DC | PRN
  Administered 2020-12-01: 1000 mg via ORAL
  Filled 2020-12-01 (×3): qty 2

## 2020-12-01 NOTE — Progress Notes (Signed)
Subjective: POD#1 Primary Cesarean Information for the patient's newborn:  Shital, Crayton [[id number]]  female    Baby Boy Circumcision complete 12/01/20  Reports feeling "ok" Feeding: breast Reports tolerating PO and denies N/V, foley removed, ambulating and urinating w/o difficulty  Pain controlled with acetaminophen and ibuprofen (OTC) Denies HA/SOB/dizziness  Flatus passing Vaginal bleeding is normal, no clots     Objective:  VS:  Vitals:   11/30/20 1607 11/30/20 1823 11/30/20 2250 12/01/20 0526  BP:   (!) 97/53 90/63  Pulse: 90  69 72  Resp: 18  16 16   Temp:   98.8 F (37.1 C) 98.7 F (37.1 C)  TempSrc:   Oral Oral  SpO2: 100% 100% 100% 100%  Weight:      Height:        Intake/Output Summary (Last 24 hours) at 12/01/2020 0914 Last data filed at 12/01/2020 0559 Gross per 24 hour  Intake 1635.1 ml  Output 1350 ml  Net 285.1 ml     Recent Labs    11/29/20 0014 12/01/20 0435  WBC 10.0 14.1*  HGB 9.7* 8.0*  HCT 28.8* 23.3*  PLT 264 204    Blood type: --/--/O POS (05/11 2225) Rubella:   Immune   Physical Exam:  General: alert, cooperative and no distress CV: Regular rate and rhythm Resp: clear Abdomen: soft, nontender, normal bowel sounds Incision: clean, dry and intact Perineum:  Uterine Fundus: firm, below umbilicus, nontender Lochia: minimal Ext: extremities normal, atraumatic, no cyanosis or edema   Assessment/Plan: 25 y.o.   POD# 1. G1P1001 Primary Cesarean Section                 Active Problems:   Normal labor   Exercise-induced asthma   Depressive disorder   Anemia of pregnancy   Cesarean delivery delivered   Routine post-op PP care          Advance diet as tolerated Advised warm fluids and ambulation to improve GI motility Encourage rest when baby rests Breastfeeding support Anticipate D/C 12/02/20 Dr 12/04/20 aware of pt status and POC  Mora Appl, MSN, CNM 12/01/2020, 9:14 AM

## 2020-12-01 NOTE — Lactation Note (Signed)
This note was copied from a baby's chart. Lactation Consultation Note  Patient Name: Sara Duran Today's Date: 12/01/2020 Reason for consult: Follow-up assessment;Mother's request;Difficult latch;Primapara;1st time breastfeeding;Early term 37-38.6wks;Nipple pain/trauma Age:23 hours   Mom latching infant with some pain. Infant has thick labial attachment and posterior tongue attachment. LC did some suck training and I can feel tongue on the base of my finger following training.  LC assisted Mom with latching in football placing infant STS with breast compressions and lips flanged to look for swallows. Signs of milk transfer noted.   Mom's breast are full but not hard. LC did some hand expression colostrum noted. With breast compression during feeding infant able to soften breasts.   Mom to offer EBM from hand expression or use of manual pump after feeds to increase volume.  Mom to use EBM for nipple care. Compression stripe but no abrasions noted.  Mom provided with comfort gels to use for pain. Mom to rinse in between use and discard after 6 days. Mom aware not to use comfort gels with coconut oil.  Mom to call RN or [facility] for assistance if needed.   Maternal Data Has patient been taught Hand Expression?: Yes Does the patient have breastfeeding experience prior to this delivery?: No  Feeding Mother's Current Feeding Choice: Breast Milk  LATCH Score Latch: Repeated attempts needed to sustain latch, nipple held in mouth throughout feeding, stimulation needed to elicit sucking reflex.  Audible Swallowing: Spontaneous and intermittent  Type of Nipple: Everted at rest and after stimulation  Comfort (Breast/Nipple): Filling, red/small blisters or bruises, mild/mod discomfort  Hold (Positioning): Assistance needed to correctly position infant at breast and maintain latch.  LATCH Score: 7   Lactation Tools Discussed/Used Tools: Pump;Flanges Flange Size: 24 Breast pump type:  Manual Pump Education: Setup, frequency, and cleaning;Milk Storage Reason for Pumping: increase stimulation Pumping frequency: every 3 hrs for 10 minutes each breasts  Interventions Interventions: Breast feeding basics reviewed;Support pillows;Education;Assisted with latch;Position options;Skin to skin;Expressed milk;Breast massage;Hand express;Breast compression;Adjust position;Hand pump;Comfort gels  Discharge Pump: Personal WIC Program: No  Consult Status Consult Status: Follow-up Date: 12/02/20    Sanvi Ehler  Nicholson-Springer 12/01/2020, 10:10 PM

## 2020-12-01 NOTE — Lactation Note (Signed)
This note was copied from a baby's chart. Lactation Consultation Note  Patient Name: Boy Jlee Harkless Today's Date: 12/01/2020  LC went in to assist Mom with next feeding following report that her nipples were sore.  Mom sleeping at the time. Comfort gels provided and Mom to call out for assistance with next latch.   Age:23 hours  Maternal Data    Feeding    LATCH Score                    Lactation Tools Discussed/Used    Interventions    Discharge    Consult Status      Jaysha Lasure  Nicholson-Springer 12/01/2020, 8:47 PM

## 2020-12-02 DIAGNOSIS — D62 Acute posthemorrhagic anemia: Secondary | ICD-10-CM | POA: Diagnosis not present

## 2020-12-02 MED ORDER — POLYSACCHARIDE IRON COMPLEX 150 MG PO CAPS: 150.0000 mg | ORAL_CAPSULE | Freq: Every day | ORAL | 2 refills | Status: AC

## 2020-12-02 MED ORDER — IBUPROFEN 600 MG PO TABS: 600.0000 mg | ORAL_TABLET | Freq: Four times a day (QID) | ORAL | 0 refills | Status: DC

## 2020-12-02 MED ORDER — POLYSACCHARIDE IRON COMPLEX 150 MG PO CAPS
150.0000 mg | ORAL_CAPSULE | Freq: Every day | ORAL | Status: DC
  Administered 2020-12-02: 150 mg via ORAL
  Filled 2020-12-02: qty 1

## 2020-12-02 MED ORDER — MAGNESIUM OXIDE -MG SUPPLEMENT 400 (240 MG) MG PO TABS
400.0000 mg | ORAL_TABLET | Freq: Every day | ORAL | Status: DC
  Administered 2020-12-02: 400 mg via ORAL
  Filled 2020-12-02: qty 1

## 2020-12-02 MED ORDER — OXYCODONE HCL 5 MG PO TABS: 5.0000 mg | ORAL_TABLET | ORAL | 0 refills | Status: AC | PRN

## 2020-12-02 NOTE — Plan of Care (Signed)
Discharge teaching given, pt receptive 

## 2020-12-02 NOTE — Discharge Summary (Signed)
Primary [facility] OB Discharge Summary     Patient Name: Sara Duran DOB: 1996/02/25 MRN: [id number]  Date of admission: 11/29/2020 Delivering MD: Hoover Browns  Date of delivery: 11/30/2020 Type of delivery: PCS  Newborn Data: Sex: Baby female Circumcision: Circ completed in pt.  Live born female  Birth Weight: 7 lb 5.1 oz (3320 g) APGAR: 8, 9  Newborn Delivery   Birth date/time: 11/30/2020 04:39:00 Delivery type: C-Section, Vacuum Assisted C-section categorization: Primary      Feeding: breast Infant being discharge to home with mother in stable condition.   Admitting diagnosis: Normal labor [O80, Z37.9] Cesarean delivery delivered [O82] Intrauterine pregnancy: [redacted]w[redacted]d     Secondary diagnosis:  Active Problems:   Normal labor   Exercise-induced asthma   Depressive disorder   Anemia of pregnancy   Cesarean delivery delivered   Acute blood loss anemia   SVD (spontaneous vaginal delivery)   Normal postpartum course                                Complications: None                                                              Intrapartum Procedures: vacuum and cesarean: low cervical, transverse Postpartum Procedures: none Complications-Operative and Postpartum: none Augmentation: N/A   History of Present Illness: Ms. Sara Duran is a 25 y.o. female, G1P1001, who presents at [redacted]w[redacted]d weeks gestation. The patient has been followed at  [facility]  Her pregnancy has been complicated by:  Patient Active Problem List   Diagnosis Date Noted  . Acute blood loss anemia 12/02/2020  . SVD (spontaneous vaginal delivery) 12/02/2020  . Normal postpartum course 12/02/2020  . Exercise-induced asthma 11/30/2020  . Depressive disorder 11/30/2020  . Anemia of pregnancy 11/30/2020  . Cesarean delivery delivered 11/30/2020  . Normal labor 11/29/2020     Active Ambulatory Problems    Diagnosis Date Noted  . No Active Ambulatory Problems    Resolved Ambulatory Problems    Diagnosis Date Noted  . No Resolved Ambulatory Problems   Past Medical History:  Diagnosis Date  . Allergy   . Anemia   . Asthma      Hospital course:  Onset of Labor With Unplanned C/S   25 y.o. yo G1P1001 at [redacted]w[redacted]d was admitted for PROM at cm on 11/29/2020. Patient had a labor course significant for fetal decels. The patient went for cesarean section due to Non-Reassuring FHR. Delivery details as follows: Membrane Rupture Time/Date:  ,   Delivery Method:C-Section, Vacuum Assisted  Details of operation can be found in separate operative note. Patient had an uncomplicated postpartum course.  She is ambulating,tolerating a regular diet, passing flatus, and urinating well.  Patient is discharged home in stable condition 12/02/20.  Newborn Data: Birth date:11/30/2020  Birth time:4:39 AM  Gender:Female  Living status:Living  Apgars:8 ,9  Weight:3320 g  POD # 2 : Hospital Course--Unscheduled Cesarean:  Admitted 5/11 for PROM at 1cm dilated with @38 .[redacted] weeks gestations,  Late fetal decel noted and unabble to progress with vsazginal delivery. Pt also had h/o anemia asthma, and depression, no meds, mood stable. Negative GBS.  Utilized spinal for pain  management.   PLTCS with vacume assist.  Due to fetal intolerance, she was consented for cesarean, with Dr. Sallye Ober performing a primary LTCS under spinal anesthesia, with delivery of a viable baby female, circ completed in pt, with weight and Apgars as listed below. Infant was in good condition and remained at the patient's bedside.  The patient was taken to recovery in good condition.  Patient planned to breast feed.  On post-op day 1, patient was doing well, tolerating a regular diet, with Hgb of 9.7-8.0 placed on PO Iron today. Asymptomatic.  Throughout her stay, her physical exam was WNL, her incision was CDI, and her vital signs remained stable.  By post-op day 1, she was up ad lib, tolerating a regular diet, with good  pain control with po med.  She was deemed to have received the full benefit of her hospital stay, and was discharged home in stable condition.  Contraceptive choice was undecided. Denies SI/HI, will have 2 week mood check and 6 week sPPV, pt meets criteria for discharge today.   Physical exam  Vitals:   12/01/20 0526 12/01/20 1230 12/01/20 2051 12/02/20 0511  BP: 90/63 98/70 (!) 104/57 99/65  Pulse: 72 68 84 74  Resp: 16 18 16 16   Temp: 98.7 F (37.1 C) 97.6 F (36.4 C) 98 F (36.7 C) 98 F (36.7 C)  TempSrc: Oral Oral Oral Oral  SpO2: 100% 100%    Weight:      Height:       General: alert, cooperative and no distress Lochia: appropriate Uterine Fundus: firm Incision: Healing well with no significant drainage, No significant erythema, Dressing is clean, dry, and intact, honeycomb dressing CDI Perineum: intact DVT Evaluation: No evidence of DVT seen on physical exam. Negative Homan's sign. No cords or calf tenderness. No significant calf/ankle edema.  Labs: Lab Results  Component Value Date   WBC 14.1 (H) 12/01/2020   HGB 8.0 (L) 12/01/2020   HCT 23.3 (L) 12/01/2020   MCV 95.5 12/01/2020   PLT 204 12/01/2020   CMP Latest Ref Rng & Units 05/15/2019  Glucose 70 - 99 mg/dL 05/17/2019)  BUN 6 - 20 mg/dL 7  Creatinine 500(B - 7.04 mg/dL 8.88  Sodium 9.16 - 945 mmol/L 136  Potassium 3.5 - 5.1 mmol/L 3.8  Chloride 98 - 111 mmol/L 102  CO2 22 - 32 mmol/L 23  Calcium 8.9 - 10.3 mg/dL 9.0  Total Protein 6.5 - 8.1 g/dL 7.3  Total Bilirubin 0.3 - 1.2 mg/dL 0.3  Alkaline Phos 38 - 126 U/L 72  AST 15 - 41 U/L 27  ALT 0 - 44 U/L 13    Date of discharge: 12/02/2020 Discharge Diagnoses: Term Pregnancy-delivered Discharge instruction: per After Visit Summary and "Baby and Me Booklet".  After visit meds:   Activity:           unrestricted and pelvic rest Advance as tolerated. Pelvic rest for 6 weeks.  Diet:                routine Medications: PNV, Ibuprofen, Colace, Iron and oxy  ir Postpartum contraception: Undecided Condition:  Pt discharge to home with baby in stable and condition H/O Dep: NO meds, mood check in 2 weeks Anemia: PO Iron.   Meds: Allergies as of 12/02/2020   No Known Allergies     Medication List    STOP taking these medications   ondansetron 4 MG disintegrating tablet Commonly known as: Zofran ODT     TAKE these  medications   acetaminophen 500 MG tablet Commonly known as: TYLENOL Take 500 mg by mouth as needed.   ibuprofen 600 MG tablet Commonly known as: ADVIL Take 1 tablet (600 mg total) by mouth every 6 (six) hours.   iron polysaccharides 150 MG capsule Commonly known as: NIFEREX Take 1 capsule (150 mg total) by mouth daily.   oxyCODONE 5 MG immediate release tablet Commonly known as: Oxy IR/ROXICODONE Take 1 tablet (5 mg total) by mouth every 4 (four) hours as needed for moderate pain.            Discharge Care Instructions  (From admission, onward)         Start     Ordered   12/02/20 0000  Discharge wound care:       Comments: Take dressing off on day 5-7 postpartum.  Report increased drainage, redness or warmth. Clean with water, let soap trickle down body. Can leave steri strips on until they fall off or take them off gently at day 10. Keep open to air, clean and dry.   12/02/20 1050          Discharge Follow Up:   Follow-up Information    Hoover Browns, MD. Schedule an appointment as soon as possible for a visit in 6 week(s).   Specialty: Obstetrics and Gynecology Contact information: [address] [place] [address] [phone]        [facility] Follow up.   Specialty: Obstetrics and Gynecology Why: 2 week mood check and 6 weeks PPV.  Contact information: [address] [place] [place] [address] [place] [address] [phone]               [place], NP-C, CNM 12/02/2020, 10:50 AM  Dale Rome, FNP

## 2020-12-04 LAB — SURGICAL PATHOLOGY

## 2021-03-19 ENCOUNTER — Emergency Department (HOSPITAL_BASED_OUTPATIENT_CLINIC_OR_DEPARTMENT_OTHER)
Admission: EM | Admit: 2021-03-19 | Discharge: 2021-03-19 | Disposition: A | Discharge status: Home / Self Care | Payer: BC Managed Care – PPO | Attending: Emergency Medicine | Admitting: Emergency Medicine

## 2021-03-19 ENCOUNTER — Telehealth (HOSPITAL_BASED_OUTPATIENT_CLINIC_OR_DEPARTMENT_OTHER): Payer: Self-pay | Admitting: Emergency Medicine

## 2021-03-19 ENCOUNTER — Encounter (HOSPITAL_BASED_OUTPATIENT_CLINIC_OR_DEPARTMENT_OTHER): Payer: Self-pay

## 2021-03-19 ENCOUNTER — Other Ambulatory Visit: Payer: Self-pay

## 2021-03-19 DIAGNOSIS — N644 Mastodynia: Secondary | ICD-10-CM | POA: Diagnosis present

## 2021-03-19 DIAGNOSIS — J45909 Unspecified asthma, uncomplicated: Secondary | ICD-10-CM | POA: Diagnosis not present

## 2021-03-19 LAB — PREGNANCY, URINE: Preg Test, Ur: NEGATIVE

## 2021-03-19 MED ORDER — ONDANSETRON 4 MG PO TBDP: 4.0000 mg | ORAL_TABLET | Freq: Three times a day (TID) | ORAL | 0 refills | Status: AC | PRN

## 2021-03-19 MED ORDER — ONDANSETRON 4 MG PO TBDP
4.0000 mg | ORAL_TABLET | Freq: Once | ORAL | Status: AC
  Administered 2021-03-19: 4 mg via ORAL
  Filled 2021-03-19: qty 1

## 2021-03-19 NOTE — Discharge Instructions (Signed)
You were seen today with concerns for breast pain.  You do not have any signs of mastitis on exam.  This is likely occurring when you have had a longer period of time since breast emptying.  Massage the breast when you empty.  Monitor for fevers and redness of the breast.  If you are having difficulty with pump output, you may use a warm compress and gentle massage while pumping which may help with letdown.

## 2021-03-19 NOTE — Telephone Encounter (Signed)
Patient had been offered Zofran by previous provider.  Initially did not want but now is requesting.  Will fill

## 2021-03-19 NOTE — ED Triage Notes (Addendum)
Pt is present for bilateral breast pain that has been intermittent x one month. Pt thinks it may be "mastitis". Pt also c/o nausea but no vomiting. Pt unsure if it is related to breast pain. Pt currently breastfeeds and pumps for three month old child appx 7-8x throughout the day. Afebrile.

## 2021-03-19 NOTE — ED Provider Notes (Signed)
[facility] EMERGENCY DEPT Provider Note   CSN: [id number] Arrival date & time: 03/19/21  2831     History Chief Complaint  Patient presents with   Breast Pain    Sara Duran is a 25 y.o. female.  HPI     Is a 25 year old female who presents with bilateral breast pain.  She is currently breast-feeding and pumping for her 7-month-old infant.  She states intermittently she has noted pain and swelling in the bilateral lateral breasts and axilla.  She woke up this morning nauseous but not that vomiting.  She is unsure whether this is related to her breast pain.  She states in general her pump volume output has declined over the last month.  However, her child is exclusively breast fed and pumps for and is gaining weight appropriately.  She is not noted any fevers or redness of the breast.  She is unsure if she may be pregnant again.   Past Medical History:  Diagnosis Date   Allergy    Anemia    Asthma     Patient Active Problem List   Diagnosis Date Noted   Acute blood loss anemia 12/02/2020   SVD (spontaneous vaginal delivery) 12/02/2020   Normal postpartum course 12/02/2020   Exercise-induced asthma 11/30/2020   Depressive disorder 11/30/2020   Anemia of pregnancy 11/30/2020   Cesarean delivery delivered 11/30/2020   Normal labor 11/29/2020    Past Surgical History:  Procedure Laterality Date   CESAREAN SECTION N/A 11/30/2020   Procedure: CESAREAN SECTION;  Surgeon: Hoover Browns, MD;  Location: [facility] LD ORS;  Service: Obstetrics;  Laterality: N/A;   WISDOM TOOTH EXTRACTION       OB History     Gravida  1   Para  1   Term  1   Preterm      AB      Living  1      SAB      IAB      Ectopic      Multiple  0   Live Births  1           Family History  Problem Relation Age of Onset   Diabetes Paternal Grandfather    Colon cancer Neg Hx     Social History   Tobacco Use   Smoking status: Never   Smokeless tobacco: Never   Vaping Use   Vaping Use: Never used  Substance Use Topics   Alcohol use: Not Currently    Comment: occasional   Drug use: Never    Home Medications Prior to Admission medications   Medication Sig Start Date End Date Taking? Authorizing Provider  acetaminophen (TYLENOL) 500 MG tablet Take 500 mg by mouth as needed.    [provider]  ibuprofen (ADVIL) 600 MG tablet Take 1 tablet (600 mg total) by mouth every 6 (six) hours. 12/02/20   Dale Cromberg, FNP  iron polysaccharides (NIFEREX) 150 MG capsule Take 1 capsule (150 mg total) by mouth daily. 12/02/20   Dale Esmeralda, FNP  oxyCODONE (OXY IR/ROXICODONE) 5 MG immediate release tablet Take 1 tablet (5 mg total) by mouth every 4 (four) hours as needed for moderate pain. 12/02/20   Dale [place], FNP    Allergies    Patient has no known allergies.  Review of Systems   Review of Systems  Constitutional:  Negative for fever.  Respiratory:  Negative for shortness of breath.   Cardiovascular:  Negative for chest pain.  Gastrointestinal:  Positive for nausea.  Skin:  Negative for color change.  All other systems reviewed and are negative.  Physical Exam Updated Vital Signs BP 119/82 (BP Location: Right Arm)   Pulse 83   Temp 99.4 F (37.4 C) (Oral)   Resp 17   Ht 1.778 m (5\' 10" )   Wt 65.8 kg   SpO2 100%   Breastfeeding Yes   BMI 20.81 kg/m   Physical Exam Vitals and nursing note reviewed. Exam conducted with a chaperone present.  Constitutional:      Appearance: She is well-developed. She is not ill-appearing.  HENT:     Head: Normocephalic and atraumatic.     Mouth/Throat:     Mouth: Mucous membranes are moist.  Eyes:     Pupils: Pupils are equal, round, and reactive to light.  Cardiovascular:     Rate and Rhythm: Normal rate and regular rhythm.  Pulmonary:     Effort: Pulmonary effort is normal. No respiratory distress.  Chest:  Breasts:    Right: No mass, skin change or tenderness.     Left: No mass,  skin change or tenderness.     Comments: Bilateral breasts are soft and not engorged, no nodes or clogged ducts noted, no overlying skin changes or point tenderness to palpation Abdominal:     Palpations: Abdomen is soft.  Musculoskeletal:     Cervical back: Neck supple.  Lymphadenopathy:     Upper Body:     Right upper body: No axillary adenopathy.     Left upper body: No axillary adenopathy.  Skin:    General: Skin is warm and dry.  Neurological:     Mental Status: She is alert and oriented to person, place, and time.  Psychiatric:        Mood and Affect: Mood normal.    ED Results / Procedures / Treatments   Labs (all labs ordered are listed, but only abnormal results are displayed) Labs Reviewed  PREGNANCY, URINE    EKG None  Radiology No results found.  Procedures Procedures   Medications Ordered in ED Medications  ondansetron (ZOFRAN-ODT) disintegrating tablet 4 mg (has no administration in time range)    ED Course  I have reviewed the triage vital signs and the nursing notes.  Pertinent labs & imaging results that were available during my care of the patient were reviewed by me and considered in my medical decision making (see chart for details).    MDM Rules/Calculators/A&P                           Patient presents with intermittent breast pain and nausea.  She is nontoxic and vital signs are reassuring.  She is afebrile.  Patient does not have any signs or symptoms on exam suggestive of mastitis.  I highly suspect that her intermittent pain has to do with engorgement between feedings.  She and I had a long conversation regarding pump output.  She is discouraged that her pump output is not as much as her child eats.  We discussed ways to help her with pump output and manage symptoms of breast pain in between pumping.  Did send urine pregnancy test to ensure that she is not pregnant as this would also affect pump volumes.  This is pending.  Patient signed  out to oncoming provider.  Final Clinical Impression(s) / ED Diagnoses Final diagnoses:  Breast pain    Rx / DC Orders ED Discharge Orders  None        Shon Baton, MD 03/19/21 [phone]

## 2021-03-19 NOTE — ED Notes (Signed)
Pt verbalizes understanding of discharge instructions. Opportunity for questioning and answers were provided. Armand removed by staff, pt discharged from ED to home. Educated on breast care.

## 2021-05-22 ENCOUNTER — Other Ambulatory Visit: Payer: Self-pay

## 2021-05-22 ENCOUNTER — Encounter (HOSPITAL_BASED_OUTPATIENT_CLINIC_OR_DEPARTMENT_OTHER): Payer: Self-pay

## 2021-05-22 ENCOUNTER — Emergency Department (HOSPITAL_BASED_OUTPATIENT_CLINIC_OR_DEPARTMENT_OTHER)
Admission: EM | Admit: 2021-05-22 | Discharge: 2021-05-22 | Disposition: A | Discharge status: Home / Self Care | Payer: BC Managed Care – PPO | Attending: Emergency Medicine | Admitting: Emergency Medicine

## 2021-05-22 ENCOUNTER — Emergency Department (HOSPITAL_BASED_OUTPATIENT_CLINIC_OR_DEPARTMENT_OTHER): Payer: BC Managed Care – PPO

## 2021-05-22 DIAGNOSIS — Z20822 Contact with and (suspected) exposure to covid-19: Secondary | ICD-10-CM | POA: Diagnosis not present

## 2021-05-22 DIAGNOSIS — J45909 Unspecified asthma, uncomplicated: Secondary | ICD-10-CM | POA: Insufficient documentation

## 2021-05-22 DIAGNOSIS — N200 Calculus of kidney: Secondary | ICD-10-CM | POA: Insufficient documentation

## 2021-05-22 DIAGNOSIS — R000 Tachycardia, unspecified: Secondary | ICD-10-CM | POA: Diagnosis not present

## 2021-05-22 DIAGNOSIS — N12 Tubulo-interstitial nephritis, not specified as acute or chronic: Secondary | ICD-10-CM

## 2021-05-22 DIAGNOSIS — M545 Low back pain, unspecified: Secondary | ICD-10-CM | POA: Diagnosis present

## 2021-05-22 LAB — CBC WITH DIFFERENTIAL/PLATELET
Abs Immature Granulocytes: 0.02 10*3/uL (ref 0.00–0.07)
Basophils Absolute: 0 10*3/uL (ref 0.0–0.1)
Basophils Relative: 0 %
Eosinophils Absolute: 0 10*3/uL (ref 0.0–0.5)
Eosinophils Relative: 0 %
HCT: 39.4 % (ref 36.0–46.0)
Hemoglobin: 12.9 g/dL (ref 12.0–15.0)
Immature Granulocytes: 0 %
Lymphocytes Relative: 10 %
Lymphs Abs: 0.9 10*3/uL (ref 0.7–4.0)
MCH: 29.1 pg (ref 26.0–34.0)
MCHC: 32.7 g/dL (ref 30.0–36.0)
MCV: 88.7 fL (ref 80.0–100.0)
Monocytes Absolute: 0.6 10*3/uL (ref 0.1–1.0)
Monocytes Relative: 7 %
Neutro Abs: 7.3 10*3/uL (ref 1.7–7.7)
Neutrophils Relative %: 83 %
Platelets: 308 10*3/uL (ref 150–400)
RBC: 4.44 MIL/uL (ref 3.87–5.11)
RDW: 13.6 % (ref 11.5–15.5)
WBC: 8.8 10*3/uL (ref 4.0–10.5)
nRBC: 0 % (ref 0.0–0.2)

## 2021-05-22 LAB — URINALYSIS, ROUTINE W REFLEX MICROSCOPIC
Bilirubin Urine: NEGATIVE
Glucose, UA: NEGATIVE mg/dL
Nitrite: NEGATIVE
Protein, ur: 30 mg/dL — AB
Specific Gravity, Urine: 1.012 (ref 1.005–1.030)
WBC, UA: >50 WBC/hpf — ABNORMAL HIGH (ref 0–5)
pH: 6 (ref 5.0–8.0)

## 2021-05-22 LAB — COMPREHENSIVE METABOLIC PANEL
ALT: 12 U/L (ref 0–44)
AST: 17 U/L (ref 15–41)
Albumin: 4.6 g/dL (ref 3.5–5.0)
Alkaline Phosphatase: 77 U/L (ref 38–126)
Anion gap: 9 (ref 5–15)
BUN: 9 mg/dL (ref 6–20)
CO2: 27 mmol/L (ref 22–32)
Calcium: 9.5 mg/dL (ref 8.9–10.3)
Chloride: 100 mmol/L (ref 98–111)
Creatinine, Ser: 1.2 mg/dL — ABNORMAL HIGH (ref 0.44–1.00)
GFR, Estimated: >60 mL/min (ref >60)
Glucose, Bld: 102 mg/dL — ABNORMAL HIGH (ref 70–99)
Potassium: 3.9 mmol/L (ref 3.5–5.1)
Sodium: 136 mmol/L (ref 135–145)
Total Bilirubin: 1.1 mg/dL (ref 0.3–1.2)
Total Protein: 8.6 g/dL — ABNORMAL HIGH (ref 6.5–8.1)

## 2021-05-22 LAB — RESP PANEL BY RT-PCR (FLU A&B, COVID) ARPGX2
Influenza A by PCR: NEGATIVE
Influenza B by PCR: NEGATIVE
SARS Coronavirus 2 by RT PCR: NEGATIVE

## 2021-05-22 LAB — PREGNANCY, URINE: Preg Test, Ur: NEGATIVE

## 2021-05-22 MED ORDER — CYCLOBENZAPRINE HCL 10 MG PO TABS: 10.0000 mg | ORAL_TABLET | Freq: Three times a day (TID) | ORAL | 0 refills | Status: DC | PRN

## 2021-05-22 MED ORDER — SODIUM CHLORIDE 0.9 % IV SOLN
1.0000 g | Freq: Once | INTRAVENOUS | Status: AC
  Administered 2021-05-22: 1 g via INTRAVENOUS
  Filled 2021-05-22: qty 10

## 2021-05-22 MED ORDER — SODIUM CHLORIDE 0.9 % IV BOLUS
1000.0000 mL | Freq: Once | INTRAVENOUS | Status: AC
  Administered 2021-05-22: 1000 mL via INTRAVENOUS

## 2021-05-22 MED ORDER — IBUPROFEN 400 MG PO TABS
600.0000 mg | ORAL_TABLET | Freq: Once | ORAL | Status: AC
  Administered 2021-05-22: 600 mg via ORAL
  Filled 2021-05-22: qty 1

## 2021-05-22 MED ORDER — CEFDINIR 300 MG PO CAPS: 300.0000 mg | ORAL_CAPSULE | Freq: Two times a day (BID) | ORAL | 0 refills | Status: AC

## 2021-05-22 MED ORDER — CEFDINIR 300 MG PO CAPS: 300.0000 mg | ORAL_CAPSULE | Freq: Two times a day (BID) | ORAL | 0 refills | Status: DC

## 2021-05-22 MED ORDER — ACETAMINOPHEN 325 MG PO TABS
650.0000 mg | ORAL_TABLET | Freq: Once | ORAL | Status: AC
  Administered 2021-05-22: 650 mg via ORAL
  Filled 2021-05-22: qty 2

## 2021-05-22 NOTE — ED Provider Notes (Signed)
[facility] EMERGENCY DEPT Provider Note   CSN: [id number] Arrival date & time: 05/22/21  [id number]     History Chief Complaint  Patient presents with   Back Pain    Sara Duran is a 25 y.o. female.  HPI 25 year old female presents with a chief complaint of right back pain and fever/chills.  Has had this right-sided back pain that was atraumatic for about 3 or 4 days.  Tylenol seem to help it last night.  Right now is rated as a 6 out of 10.  Starts to radiate around towards her abdomen but stops in the mid axillary line.  It is a continuous sensation but is worse with certain movements.  Seems to be okay at rest.  Last night/this morning she developed subjective fever and chills and woke up sweating.  She denies a cough though she is now developing some pain with inspiration.  This is somewhat similar to when she had a kidney infection years ago.  Over the last day or so she has noticed increased urinary frequency but only small amounts coming out.  It does not hurt or burn and there is no hematuria.  No vomiting but she has been nauseated.  She denies weakness or numbness in her extremities or incontinence.  Past Medical History:  Diagnosis Date   Allergy    Anemia    Asthma     Patient Active Problem List   Diagnosis Date Noted   Acute blood loss anemia 12/02/2020   SVD (spontaneous vaginal delivery) 12/02/2020   Normal postpartum course 12/02/2020   Exercise-induced asthma 11/30/2020   Depressive disorder 11/30/2020   Anemia of pregnancy 11/30/2020   Cesarean delivery delivered 11/30/2020   Normal labor 11/29/2020    Past Surgical History:  Procedure Laterality Date   CESAREAN SECTION N/A 11/30/2020   Procedure: CESAREAN SECTION;  Surgeon: Waymon Amato, MD;  Location: [facility] LD ORS;  Service: Obstetrics;  Laterality: N/A;   WISDOM TOOTH EXTRACTION       OB History     Gravida  1   Para  1   Term  1   Preterm      AB      Living  1      SAB       IAB      Ectopic      Multiple  0   Live Births  1           Family History  Problem Relation Age of Onset   Diabetes Paternal Grandfather    Colon cancer Neg Hx     Social History   Tobacco Use   Smoking status: Never   Smokeless tobacco: Never  Vaping Use   Vaping Use: Never used  Substance Use Topics   Alcohol use: Not Currently    Comment: occasional   Drug use: Yes    Frequency: 3.0 times per week    Types: Marijuana    Home Medications Prior to Admission medications   Medication Sig Start Date End Date Taking? Authorizing Provider  cefdinir (OMNICEF) 300 MG capsule Take 1 capsule (300 mg total) by mouth 2 (two) times daily for 10 days. 05/22/21 06/01/21 Yes Sherwood Gambler, MD  cyclobenzaprine (FLEXERIL) 10 MG tablet Take 1 tablet (10 mg total) by mouth 3 (three) times daily as needed for muscle spasms. 05/22/21  Yes Sherwood Gambler, MD  acetaminophen (TYLENOL) 500 MG tablet Take 500 mg by mouth as needed.    [provider]  ibuprofen (ADVIL) 600 MG tablet Take 1 tablet (600 mg total) by mouth every 6 (six) hours. 12/02/20   Noralyn Pick, FNP  iron polysaccharides (NIFEREX) 150 MG capsule Take 1 capsule (150 mg total) by mouth daily. 12/02/20   Ohlman, Luvenia Starch, FNP  ondansetron (ZOFRAN ODT) 4 MG disintegrating tablet Take 1 tablet (4 mg total) by mouth every 8 (eight) hours as needed for nausea or vomiting. 03/19/21   Davonna Belling, MD  oxyCODONE (OXY IR/ROXICODONE) 5 MG immediate release tablet Take 1 tablet (5 mg total) by mouth every 4 (four) hours as needed for moderate pain. 12/02/20   Noralyn Pick, [place]    Allergies    Patient has no known allergies.  Review of Systems   Review of Systems  Constitutional:  Positive for chills and fever.  Respiratory:  Negative for cough and shortness of breath.   Gastrointestinal:  Positive for nausea. Negative for abdominal pain, diarrhea and vomiting.  Genitourinary:  Positive for frequency. Negative for  dysuria and hematuria.  Musculoskeletal:  Positive for back pain.  Neurological:  Negative for weakness and numbness.  All other systems reviewed and are negative.  Physical Exam Updated Vital Signs BP 103/78 (BP Location: Left Arm)   Pulse 93   Temp 99.9 F (37.7 C) (Oral)   Resp 15   Ht 5\' 10"  (1.778 m)   Wt 57.4 kg   LMP 05/08/2021 (Exact Date)   SpO2 100%   BMI 18.15 kg/m   Physical Exam Vitals and nursing note reviewed.  Constitutional:      General: She is not in acute distress.    Appearance: She is well-developed. She is not ill-appearing or diaphoretic.  HENT:     Head: Normocephalic and atraumatic.     Right Ear: External ear normal.     Left Ear: External ear normal.     Nose: Nose normal.  Eyes:     General:        Right eye: No discharge.        Left eye: No discharge.  Cardiovascular:     Rate and Rhythm: Regular rhythm. Tachycardia present.     Heart sounds: Normal heart sounds.  Pulmonary:     Effort: Pulmonary effort is normal.     Breath sounds: Normal breath sounds.  Abdominal:     General: There is no distension.     Palpations: Abdomen is soft.     Tenderness: There is no abdominal tenderness. There is right CVA tenderness. There is no left CVA tenderness.  Skin:    General: Skin is warm and dry.  Neurological:     Mental Status: She is alert.     Comments: 5/5 strength in both lower extremities.  Grossly normal sensation.  Psychiatric:        Mood and Affect: Mood is not anxious.    ED Results / Procedures / Treatments   Labs (all labs ordered are listed, but only abnormal results are displayed) Labs Reviewed  COMPREHENSIVE METABOLIC PANEL - Abnormal; Notable for the following components:      Result Value   Glucose, Bld 102 (*)    Creatinine, Ser 1.20 (*)    Total Protein 8.6 (*)    All other components within normal limits  URINALYSIS, ROUTINE W REFLEX MICROSCOPIC - Abnormal; Notable for the following components:   APPearance HAZY  (*)    Hgb urine dipstick LARGE (*)    Ketones, ur TRACE (*)    Protein, ur  30 (*)    Leukocytes,Ua LARGE (*)    WBC, UA >50 (*)    Bacteria, UA FEW (*)    Non Squamous Epithelial 0-5 (*)    All other components within normal limits  RESP PANEL BY RT-PCR (FLU A&B, COVID) [id number]  URINE CULTURE  CBC WITH DIFFERENTIAL/PLATELET  PREGNANCY, URINE    EKG None  Radiology DG Chest Portable 1 View  Result Date: 05/22/2021 CLINICAL DATA:  Flank pain EXAM: PORTABLE CHEST 1 VIEW COMPARISON:  None. FINDINGS: Heart size and mediastinal contours are within normal limits. No suspicious pulmonary opacities identified. No pleural effusion or pneumothorax visualized. No acute osseous abnormality appreciated. IMPRESSION: No acute intrathoracic process identified. Electronically Signed   By: Jannifer Hick M.D.   On: 05/22/2021 08:26    Procedures Procedures   Medications Ordered in ED Medications  cefTRIAXone (ROCEPHIN) 1 g in sodium chloride 0.9 % 100 mL IVPB (1 g Intravenous New Bag/Given 05/22/21 0927)  sodium chloride 0.9 % bolus 1,000 mL (1,000 mLs Intravenous New Bag/Given 05/22/21 0807)  acetaminophen (TYLENOL) tablet 650 mg (650 mg Oral Given 05/22/21 0757)    ED Course  I have reviewed the triage vital signs and the nursing notes.  Pertinent labs & imaging results that were available during my care of the patient were reviewed by me and considered in my medical decision making (see chart for details).    MDM Rules/Calculators/A&P                           Patient's appears to have pyelonephritis.  While it is unilateral flank pain, this is how her pyelonephritis last time started for her.  I doubt kidney stone as she is not really having colicky type pain.  We discussed we could do a CT but she agrees we could hold off for now and treat as pyelonephritis.  Labs are pretty unremarkable besides mild bump in creatinine but otherwise her vital signs have normalized.  She was given IV  Rocephin and we will discharge with cefdinir.  We discussed that she needs to have a low threshold to return if any symptoms worsen or do not improve. Final Clinical Impression(s) / ED Diagnoses Final diagnoses:  Pyelonephritis    Rx / DC Orders ED Discharge Orders          Ordered    cefdinir (OMNICEF) 300 MG capsule  2 times daily        05/22/21 0924    cyclobenzaprine (FLEXERIL) 10 MG tablet  3 times daily PRN        05/22/21 6063             Pricilla Loveless, MD 05/22/21 [phone]

## 2021-05-22 NOTE — ED Triage Notes (Signed)
Pt arrives POV, c/o right side thoracic back pain since Friday, pain radiates around to right side.  Reports some nausea and chills during the night.

## 2021-05-22 NOTE — ED Notes (Signed)
Pt is hot to touch and has a elevated Heart Rate. First Temp 99.4. Second Temp 99.9. Both documented.

## 2021-05-22 NOTE — ED Notes (Signed)
ED Provider at bedside. 

## 2021-05-22 NOTE — Discharge Instructions (Addendum)
If your fever does not go away in 24-48 hours, you develop new or worsening pain, you develop vomiting, or any other new/concerning symptoms then return to the ER for evaluation.  Take antibiotics until fully completed, even if you are feeling better.

## 2021-05-24 LAB — URINE CULTURE: Culture: >=100000 — AB

## 2022-07-08 IMAGING — DX DG CHEST 1V PORT
1 series · 1 of 1 positions shown · non-contrast
Comparison: None.

CLINICAL DATA: Flank pain

EXAM:
PORTABLE CHEST 1 VIEW

[chest]
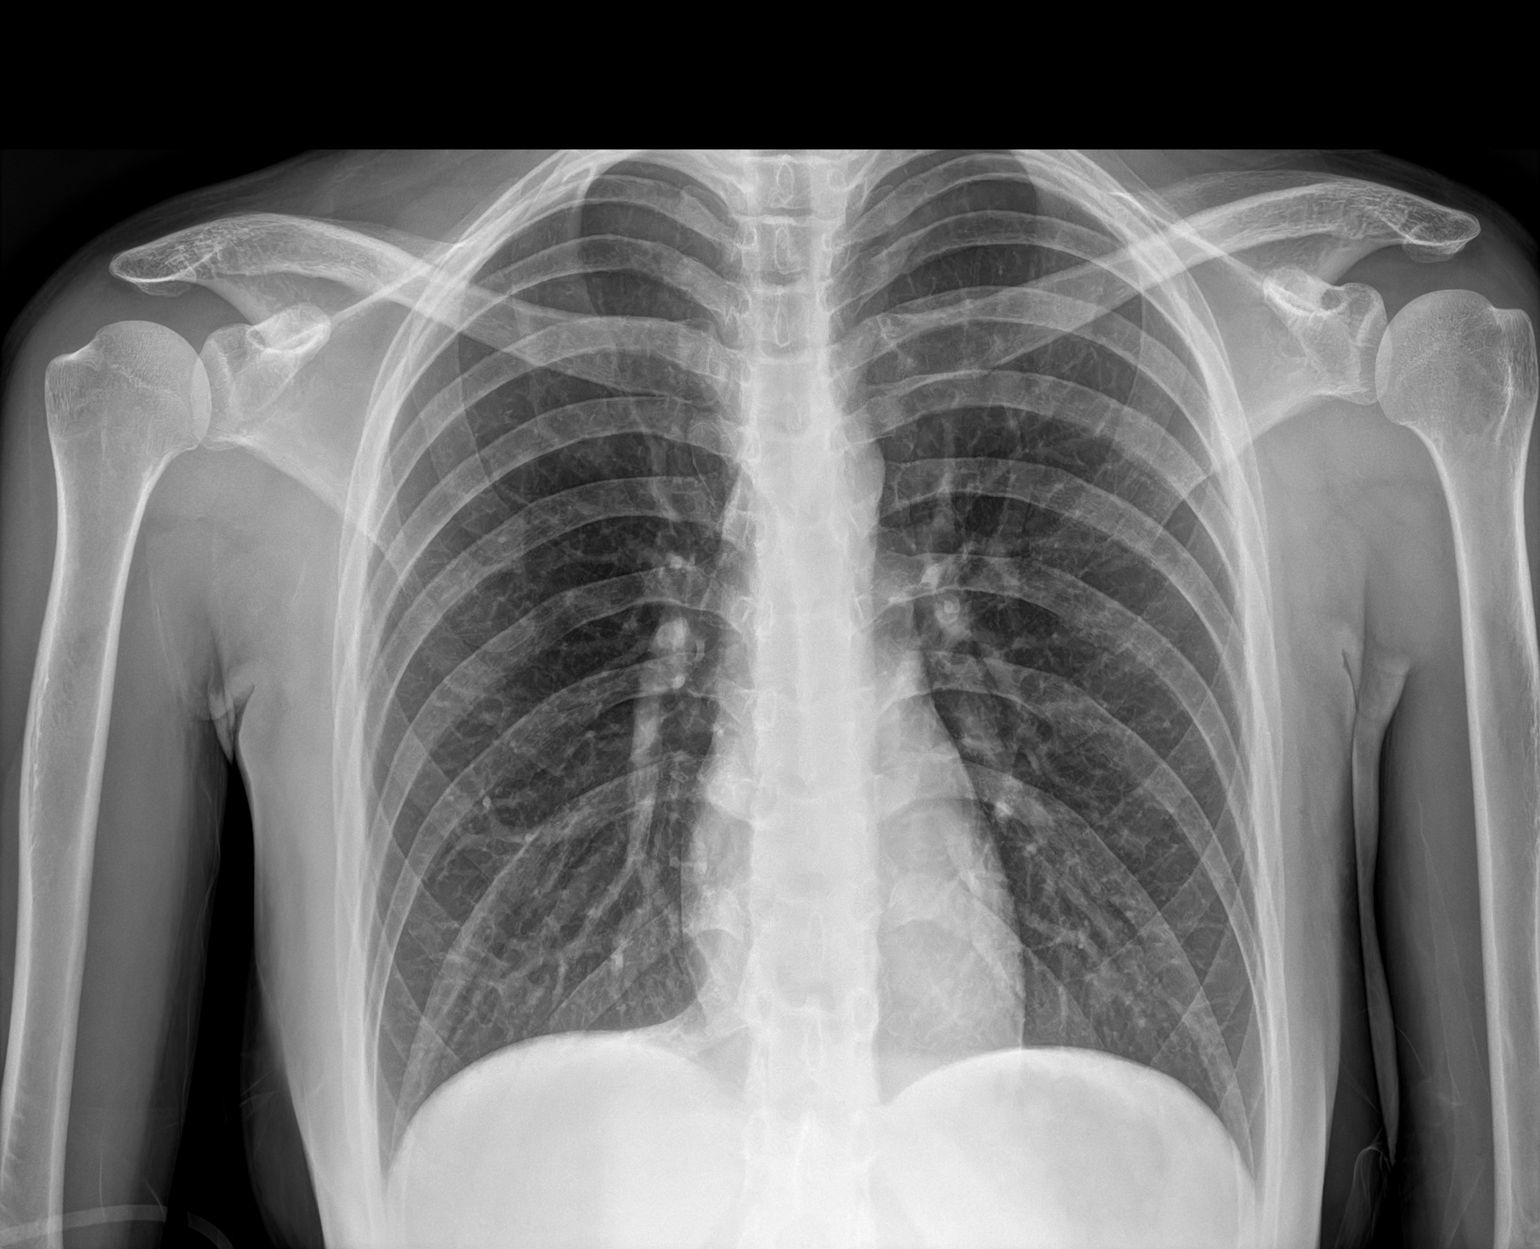

[1 of 1 positions shown; findings below may reference images not displayed]

FINDINGS: Heart size and mediastinal contours are within normal limits. No
suspicious pulmonary opacities identified.

No pleural effusion or pneumothorax visualized.

No acute osseous abnormality appreciated.
IMPRESSION: No acute intrathoracic process identified.

## 2022-09-17 ENCOUNTER — Encounter (HOSPITAL_COMMUNITY): Payer: Self-pay

## 2022-09-17 ENCOUNTER — Other Ambulatory Visit: Payer: Self-pay

## 2022-09-17 ENCOUNTER — Emergency Department (HOSPITAL_COMMUNITY)
Admission: EM | Admit: 2022-09-17 | Discharge: 2022-09-18 | Disposition: A | Discharge status: Home / Self Care | Payer: BC Managed Care – PPO | Attending: Emergency Medicine | Admitting: Emergency Medicine

## 2022-09-17 DIAGNOSIS — M549 Dorsalgia, unspecified: Secondary | ICD-10-CM | POA: Diagnosis not present

## 2022-09-17 DIAGNOSIS — M542 Cervicalgia: Secondary | ICD-10-CM | POA: Insufficient documentation

## 2022-09-17 DIAGNOSIS — M545 Low back pain, unspecified: Secondary | ICD-10-CM | POA: Diagnosis not present

## 2022-09-17 DIAGNOSIS — Y92410 Unspecified street and highway as the place of occurrence of the external cause: Secondary | ICD-10-CM | POA: Insufficient documentation

## 2022-09-17 DIAGNOSIS — R110 Nausea: Secondary | ICD-10-CM | POA: Insufficient documentation
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
## 2022-09-18 ENCOUNTER — Emergency Department (HOSPITAL_COMMUNITY): Payer: BC Managed Care – PPO

## 2022-09-18 ENCOUNTER — Emergency Department (HOSPITAL_COMMUNITY): Payer: No Typology Code available for payment source

## 2022-09-18 MED ORDER — CYCLOBENZAPRINE HCL 10 MG PO TABS
5.0000 mg | ORAL_TABLET | Freq: Once | ORAL | Status: AC
  Administered 2022-09-18: 5 mg via ORAL
  Filled 2022-09-18: qty 1

## 2022-09-18 MED ORDER — CYCLOBENZAPRINE HCL 10 MG PO TABS: 10.0000 mg | ORAL_TABLET | Freq: Two times a day (BID) | ORAL | 0 refills | Status: DC | PRN

## 2022-09-18 MED ORDER — MELOXICAM 7.5 MG PO TABS: 7.5000 mg | ORAL_TABLET | Freq: Every day | ORAL | 0 refills | Status: DC

## 2022-09-18 MED ORDER — KETOROLAC TROMETHAMINE 60 MG/2ML IM SOLN
30.0000 mg | Freq: Once | INTRAMUSCULAR | Status: AC
  Administered 2022-09-18: 30 mg via INTRAMUSCULAR
  Filled 2022-09-18: qty 2
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
## 2022-09-20 ENCOUNTER — Encounter (HOSPITAL_COMMUNITY): Payer: Self-pay | Admitting: *Deleted

## 2022-09-20 ENCOUNTER — Ambulatory Visit (HOSPITAL_COMMUNITY)
Admission: EM | Admit: 2022-09-20 | Discharge: 2022-09-20 | Disposition: A | Discharge status: Home / Self Care | Payer: BC Managed Care – PPO | Attending: Emergency Medicine | Admitting: Emergency Medicine

## 2022-09-20 DIAGNOSIS — M546 Pain in thoracic spine: Secondary | ICD-10-CM

## 2022-09-20 DIAGNOSIS — M62830 Muscle spasm of back: Secondary | ICD-10-CM | POA: Diagnosis not present

## 2022-09-20 HISTORY — DX: Scoliosis, unspecified: M41.9

## 2022-09-20 MED ORDER — METHOCARBAMOL 500 MG PO TABS: 500.0000 mg | ORAL_TABLET | Freq: Two times a day (BID) | ORAL | 0 refills | Status: AC | PRN

## 2022-09-20 MED ORDER — IBUPROFEN 800 MG PO TABS: 800.0000 mg | ORAL_TABLET | Freq: Three times a day (TID) | ORAL | 0 refills | Status: AC

## 2022-09-20 MED ORDER — LIDOCAINE 4 % EX PTCH: 1.0000 | MEDICATED_PATCH | CUTANEOUS | 0 refills | Status: AC
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
